# Patient Record
Sex: Male | Born: 1955 | Race: White | Hispanic: No | Smoking: Current every day smoker
Health system: Southern US, Community
[De-identification: ages and names within clinical notes are randomized; demographics above are authoritative.]

## PROBLEM LIST (undated history)

## (undated) DIAGNOSIS — K7400 Hepatic fibrosis, unspecified: Secondary | ICD-10-CM

## (undated) DIAGNOSIS — M545 Low back pain, unspecified: Secondary | ICD-10-CM

## (undated) DIAGNOSIS — M810 Age-related osteoporosis without current pathological fracture: Secondary | ICD-10-CM

## (undated) DIAGNOSIS — B1920 Unspecified viral hepatitis C without hepatic coma: Secondary | ICD-10-CM

## (undated) DIAGNOSIS — E785 Hyperlipidemia, unspecified: Secondary | ICD-10-CM

## (undated) DIAGNOSIS — H269 Unspecified cataract: Secondary | ICD-10-CM

## (undated) DIAGNOSIS — R002 Palpitations: Secondary | ICD-10-CM

## (undated) DIAGNOSIS — F32A Depression, unspecified: Secondary | ICD-10-CM

## (undated) DIAGNOSIS — Z8719 Personal history of other diseases of the digestive system: Secondary | ICD-10-CM

## (undated) DIAGNOSIS — Z9289 Personal history of other medical treatment: Secondary | ICD-10-CM

## (undated) DIAGNOSIS — G8929 Other chronic pain: Secondary | ICD-10-CM

## (undated) DIAGNOSIS — B009 Herpesviral infection, unspecified: Secondary | ICD-10-CM

## (undated) DIAGNOSIS — K219 Gastro-esophageal reflux disease without esophagitis: Secondary | ICD-10-CM

## (undated) DIAGNOSIS — Z87891 Personal history of nicotine dependence: Secondary | ICD-10-CM

## (undated) DIAGNOSIS — S82001A Unspecified fracture of right patella, initial encounter for closed fracture: Secondary | ICD-10-CM

## (undated) DIAGNOSIS — F329 Major depressive disorder, single episode, unspecified: Secondary | ICD-10-CM

## (undated) DIAGNOSIS — I252 Old myocardial infarction: Secondary | ICD-10-CM

## (undated) DIAGNOSIS — I10 Essential (primary) hypertension: Secondary | ICD-10-CM

## (undated) DIAGNOSIS — I2510 Atherosclerotic heart disease of native coronary artery without angina pectoris: Secondary | ICD-10-CM

## (undated) DIAGNOSIS — F419 Anxiety disorder, unspecified: Secondary | ICD-10-CM

## (undated) HISTORY — PX: CARDIAC CATHETERIZATION: SHX172

## (undated) HISTORY — DX: Other chronic pain: G89.29

## (undated) HISTORY — DX: Major depressive disorder, single episode, unspecified: F32.9

## (undated) HISTORY — DX: Palpitations: R00.2

## (undated) HISTORY — PX: EYE SURGERY: SHX253

## (undated) HISTORY — DX: Age-related osteoporosis without current pathological fracture: M81.0

## (undated) HISTORY — PX: CORONARY STENT PLACEMENT: SHX1402

## (undated) HISTORY — DX: Anxiety disorder, unspecified: F41.9

## (undated) HISTORY — DX: Unspecified cataract: H26.9

## (undated) HISTORY — DX: Gastro-esophageal reflux disease without esophagitis: K21.9

## (undated) HISTORY — DX: Hepatic fibrosis, unspecified: K74.00

## (undated) HISTORY — DX: Low back pain, unspecified: M54.50

## (undated) HISTORY — DX: Unspecified fracture of right patella, initial encounter for closed fracture: S82.001A

## (undated) HISTORY — DX: Low back pain: M54.5

## (undated) HISTORY — DX: Herpesviral infection, unspecified: B00.9

## (undated) HISTORY — DX: Unspecified viral hepatitis C without hepatic coma: B19.20

## (undated) HISTORY — DX: Personal history of nicotine dependence: Z87.891

## (undated) HISTORY — DX: Depression, unspecified: F32.A

## (undated) HISTORY — DX: Atherosclerotic heart disease of native coronary artery without angina pectoris: I25.10

## (undated) HISTORY — DX: Hyperlipidemia, unspecified: E78.5

## (undated) HISTORY — PX: KNEE SURGERY: SHX244

## (undated) HISTORY — DX: Personal history of other medical treatment: Z92.89

## (undated) HISTORY — DX: Essential (primary) hypertension: I10

## (undated) HISTORY — DX: Hepatic fibrosis: K74.0

---

## 1998-04-15 ENCOUNTER — Encounter: Payer: Self-pay | Admitting: Family Medicine

## 1998-04-15 ENCOUNTER — Ambulatory Visit (HOSPITAL_COMMUNITY): Admission: RE | Admit: 1998-04-15 | Discharge: 1998-04-15 | Payer: Self-pay | Admitting: Family Medicine

## 1998-05-16 ENCOUNTER — Ambulatory Visit (HOSPITAL_COMMUNITY): Admission: RE | Admit: 1998-05-16 | Discharge: 1998-05-16 | Payer: Self-pay | Admitting: Family Medicine

## 1998-05-16 ENCOUNTER — Encounter: Payer: Self-pay | Admitting: Family Medicine

## 1999-02-22 ENCOUNTER — Encounter: Payer: Self-pay | Admitting: Emergency Medicine

## 1999-02-22 ENCOUNTER — Emergency Department (HOSPITAL_COMMUNITY): Admission: EM | Admit: 1999-02-22 | Discharge: 1999-02-22 | Payer: Self-pay | Admitting: Emergency Medicine

## 2002-10-22 ENCOUNTER — Ambulatory Visit (HOSPITAL_COMMUNITY): Admission: RE | Admit: 2002-10-22 | Discharge: 2002-10-22 | Payer: Self-pay | Admitting: Internal Medicine

## 2002-10-22 ENCOUNTER — Encounter: Payer: Self-pay | Admitting: Internal Medicine

## 2002-12-07 ENCOUNTER — Encounter: Payer: Self-pay | Admitting: Internal Medicine

## 2002-12-07 ENCOUNTER — Ambulatory Visit (HOSPITAL_COMMUNITY): Admission: RE | Admit: 2002-12-07 | Discharge: 2002-12-07 | Payer: Self-pay | Admitting: Internal Medicine

## 2002-12-08 ENCOUNTER — Encounter: Payer: Self-pay | Admitting: Neurosurgery

## 2002-12-08 ENCOUNTER — Ambulatory Visit (HOSPITAL_COMMUNITY): Admission: RE | Admit: 2002-12-08 | Discharge: 2002-12-08 | Payer: Self-pay | Admitting: Neurosurgery

## 2002-12-29 ENCOUNTER — Encounter: Admission: RE | Admit: 2002-12-29 | Discharge: 2002-12-29 | Payer: Self-pay | Admitting: Neurosurgery

## 2003-01-17 ENCOUNTER — Encounter: Admission: RE | Admit: 2003-01-17 | Discharge: 2003-01-17 | Payer: Self-pay | Admitting: Neurosurgery

## 2003-02-26 ENCOUNTER — Ambulatory Visit (HOSPITAL_COMMUNITY): Admission: RE | Admit: 2003-02-26 | Discharge: 2003-02-26 | Payer: Self-pay | Admitting: Internal Medicine

## 2003-04-05 ENCOUNTER — Encounter: Admission: RE | Admit: 2003-04-05 | Discharge: 2003-05-04 | Payer: Self-pay | Admitting: Neurological Surgery

## 2003-04-16 ENCOUNTER — Ambulatory Visit (HOSPITAL_COMMUNITY): Admission: RE | Admit: 2003-04-16 | Discharge: 2003-04-16 | Payer: Self-pay | Admitting: Internal Medicine

## 2004-10-31 ENCOUNTER — Inpatient Hospital Stay (HOSPITAL_COMMUNITY): Admission: EM | Admit: 2004-10-31 | Discharge: 2004-11-04 | Payer: Self-pay | Admitting: Emergency Medicine

## 2004-11-02 ENCOUNTER — Ambulatory Visit: Payer: Self-pay | Admitting: Cardiology

## 2004-11-11 ENCOUNTER — Ambulatory Visit: Payer: Self-pay | Admitting: Nurse Practitioner

## 2004-11-14 ENCOUNTER — Ambulatory Visit: Payer: Self-pay | Admitting: Nurse Practitioner

## 2004-11-14 ENCOUNTER — Ambulatory Visit: Payer: Self-pay

## 2004-11-25 ENCOUNTER — Ambulatory Visit: Payer: Self-pay | Admitting: Nurse Practitioner

## 2004-12-04 ENCOUNTER — Ambulatory Visit: Payer: Self-pay | Admitting: *Deleted

## 2005-01-28 ENCOUNTER — Ambulatory Visit: Payer: Self-pay | Admitting: Nurse Practitioner

## 2005-02-03 ENCOUNTER — Ambulatory Visit: Payer: Self-pay | Admitting: Cardiology

## 2005-04-13 ENCOUNTER — Ambulatory Visit: Payer: Self-pay | Admitting: Nurse Practitioner

## 2005-05-13 ENCOUNTER — Ambulatory Visit: Payer: Self-pay | Admitting: Nurse Practitioner

## 2005-06-12 ENCOUNTER — Ambulatory Visit: Payer: Self-pay | Admitting: Nurse Practitioner

## 2005-06-15 ENCOUNTER — Ambulatory Visit: Payer: Self-pay | Admitting: Nurse Practitioner

## 2005-06-23 ENCOUNTER — Ambulatory Visit: Payer: Self-pay | Admitting: Nurse Practitioner

## 2005-07-24 ENCOUNTER — Ambulatory Visit: Payer: Self-pay | Admitting: Cardiology

## 2005-12-18 ENCOUNTER — Ambulatory Visit: Payer: Self-pay | Admitting: Nurse Practitioner

## 2006-01-25 ENCOUNTER — Ambulatory Visit: Payer: Self-pay | Admitting: Cardiology

## 2006-04-30 ENCOUNTER — Ambulatory Visit: Payer: Self-pay | Admitting: Cardiology

## 2006-04-30 LAB — CONVERTED CEMR LAB
Bilirubin, Direct: 0.1 mg/dL (ref 0.0–0.3)
Cholesterol: 126 mg/dL (ref 0–200)
HDL: 43.1 mg/dL (ref >39.0)
LDL Cholesterol: 73 mg/dL (ref 0–99)
Total Bilirubin: 1.1 mg/dL (ref 0.3–1.2)
Total CHOL/HDL Ratio: 2.9
Total Protein: 6.4 g/dL (ref 6.0–8.3)
Triglycerides: 52 mg/dL (ref 0–149)

## 2006-05-20 ENCOUNTER — Ambulatory Visit: Payer: Self-pay | Admitting: Nurse Practitioner

## 2006-06-08 ENCOUNTER — Ambulatory Visit: Payer: Self-pay

## 2006-08-03 ENCOUNTER — Ambulatory Visit: Payer: Self-pay | Admitting: Internal Medicine

## 2006-08-13 ENCOUNTER — Ambulatory Visit (HOSPITAL_COMMUNITY): Admission: RE | Admit: 2006-08-13 | Discharge: 2006-08-13 | Payer: Self-pay | Admitting: Family Medicine

## 2006-10-12 DIAGNOSIS — I2510 Atherosclerotic heart disease of native coronary artery without angina pectoris: Secondary | ICD-10-CM | POA: Insufficient documentation

## 2006-10-12 DIAGNOSIS — M545 Low back pain: Secondary | ICD-10-CM

## 2006-10-12 DIAGNOSIS — I252 Old myocardial infarction: Secondary | ICD-10-CM

## 2006-10-12 DIAGNOSIS — F329 Major depressive disorder, single episode, unspecified: Secondary | ICD-10-CM

## 2006-10-12 DIAGNOSIS — K219 Gastro-esophageal reflux disease without esophagitis: Secondary | ICD-10-CM

## 2006-10-27 ENCOUNTER — Encounter (INDEPENDENT_AMBULATORY_CARE_PROVIDER_SITE_OTHER): Payer: Self-pay | Admitting: Nurse Practitioner

## 2006-10-27 ENCOUNTER — Ambulatory Visit: Payer: Self-pay | Admitting: Internal Medicine

## 2006-10-27 LAB — CONVERTED CEMR LAB: PSA: 0.23 ng/mL (ref 0.10–4.00)

## 2006-11-01 ENCOUNTER — Ambulatory Visit: Payer: Self-pay | Admitting: Cardiology

## 2006-11-01 LAB — CONVERTED CEMR LAB
ALT: 41 units/L (ref 0–53)
AST: 39 units/L — ABNORMAL HIGH (ref 0–37)
Alkaline Phosphatase: 28 units/L — ABNORMAL LOW (ref 39–117)
Bilirubin, Direct: 0.2 mg/dL (ref 0.0–0.3)
Cholesterol: 119 mg/dL (ref 0–200)
HDL: 43.3 mg/dL (ref >39.0)
LDL Cholesterol: 61 mg/dL (ref 0–99)
Total Bilirubin: 1.2 mg/dL (ref 0.3–1.2)
Total Protein: 7 g/dL (ref 6.0–8.3)

## 2006-11-10 ENCOUNTER — Encounter (INDEPENDENT_AMBULATORY_CARE_PROVIDER_SITE_OTHER): Payer: Self-pay | Admitting: *Deleted

## 2006-11-15 ENCOUNTER — Emergency Department (HOSPITAL_COMMUNITY): Admission: EM | Admit: 2006-11-15 | Discharge: 2006-11-15 | Payer: Self-pay | Admitting: *Deleted

## 2006-11-17 ENCOUNTER — Emergency Department (HOSPITAL_COMMUNITY): Admission: EM | Admit: 2006-11-17 | Discharge: 2006-11-17 | Payer: Self-pay | Admitting: Family Medicine

## 2007-01-13 ENCOUNTER — Ambulatory Visit: Payer: Self-pay | Admitting: Internal Medicine

## 2007-04-07 ENCOUNTER — Ambulatory Visit (HOSPITAL_BASED_OUTPATIENT_CLINIC_OR_DEPARTMENT_OTHER): Admission: RE | Admit: 2007-04-07 | Discharge: 2007-04-07 | Payer: Self-pay | Admitting: Orthopedic Surgery

## 2007-04-12 ENCOUNTER — Encounter: Admission: RE | Admit: 2007-04-12 | Discharge: 2007-04-29 | Payer: Self-pay | Admitting: Orthopedic Surgery

## 2007-04-29 ENCOUNTER — Ambulatory Visit: Payer: Self-pay | Admitting: Cardiology

## 2007-05-04 ENCOUNTER — Ambulatory Visit: Payer: Self-pay | Admitting: Cardiology

## 2007-05-04 LAB — CONVERTED CEMR LAB
AST: 33 units/L (ref 0–37)
Albumin: 3.9 g/dL (ref 3.5–5.2)
Alkaline Phosphatase: 31 units/L — ABNORMAL LOW (ref 39–117)
Triglycerides: 72 mg/dL (ref 0–149)
VLDL: 14 mg/dL (ref 0–40)

## 2007-05-27 ENCOUNTER — Ambulatory Visit: Payer: Self-pay | Admitting: Internal Medicine

## 2007-09-15 ENCOUNTER — Ambulatory Visit: Payer: Self-pay | Admitting: Internal Medicine

## 2007-09-21 ENCOUNTER — Ambulatory Visit: Payer: Self-pay | Admitting: Internal Medicine

## 2007-09-21 LAB — CONVERTED CEMR LAB
ALT: 59 units/L — ABNORMAL HIGH (ref 0–53)
Bilirubin, Direct: 0.2 mg/dL (ref 0.0–0.3)
Cholesterol: 173 mg/dL (ref 0–200)
LDL Cholesterol: 99 mg/dL (ref 0–99)
Total CHOL/HDL Ratio: 3.1
Total Protein: 7.5 g/dL (ref 6.0–8.3)
VLDL: 19 mg/dL (ref 0–40)

## 2007-11-10 ENCOUNTER — Ambulatory Visit: Payer: Self-pay | Admitting: Internal Medicine

## 2007-11-10 LAB — CONVERTED CEMR LAB
AST: 27 units/L (ref 0–37)
Albumin: 5 g/dL (ref 3.5–5.2)
Alkaline Phosphatase: 35 units/L — ABNORMAL LOW (ref 39–117)
Chloride: 105 meq/L (ref 96–112)
Potassium: 4.5 meq/L (ref 3.5–5.3)
Sodium: 141 meq/L (ref 135–145)
Total Protein: 7.7 g/dL (ref 6.0–8.3)

## 2007-12-05 ENCOUNTER — Ambulatory Visit: Payer: Self-pay | Admitting: Internal Medicine

## 2007-12-05 LAB — CONVERTED CEMR LAB
Amphetamine Screen, Ur: NEGATIVE
Barbiturate Quant, Ur: NEGATIVE
Cocaine Metabolites: NEGATIVE
Marijuana Metabolite: NEGATIVE
Opiate Screen, Urine: NEGATIVE

## 2008-02-07 ENCOUNTER — Ambulatory Visit: Payer: Self-pay | Admitting: Internal Medicine

## 2008-03-06 ENCOUNTER — Ambulatory Visit: Payer: Self-pay | Admitting: Internal Medicine

## 2008-04-09 ENCOUNTER — Ambulatory Visit: Payer: Self-pay | Admitting: Cardiology

## 2008-04-09 LAB — CONVERTED CEMR LAB
GFR calc Af Amer: 90 mL/min
Glucose, Bld: 88 mg/dL (ref 70–99)
HCT: 43.1 % (ref 39.0–52.0)
INR: 1 (ref 0.8–1.0)
LDL Cholesterol: 86 mg/dL (ref 0–99)
Lymphocytes Relative: 30.6 % (ref 12.0–46.0)
Monocytes Absolute: 0.4 10*3/uL (ref 0.1–1.0)
Monocytes Relative: 7.9 % (ref 3.0–12.0)
Platelets: 186 10*3/uL (ref 150–400)
Potassium: 4.1 meq/L (ref 3.5–5.1)
Prothrombin Time: 10.5 s — ABNORMAL LOW (ref 10.9–13.3)
RDW: 12.4 % (ref 11.5–14.6)
Sodium: 139 meq/L (ref 135–145)
Total CHOL/HDL Ratio: 4.5
VLDL: 27 mg/dL (ref 0–40)

## 2008-04-12 ENCOUNTER — Ambulatory Visit (HOSPITAL_COMMUNITY): Admission: RE | Admit: 2008-04-12 | Discharge: 2008-04-12 | Payer: Self-pay | Admitting: Cardiology

## 2008-04-12 ENCOUNTER — Ambulatory Visit: Payer: Self-pay | Admitting: Cardiology

## 2008-04-30 ENCOUNTER — Ambulatory Visit: Payer: Self-pay | Admitting: Internal Medicine

## 2008-04-30 LAB — CONVERTED CEMR LAB
Band Neutrophils: 0 %
Basophils Absolute: 0 K/uL
Basophils Relative: 0 %
Eosinophils Absolute: 0 K/uL
Eosinophils Relative: 1 %
HCT: 45.4 %
Hemoglobin: 15.1 g/dL
Lymphocytes Relative: 20 %
Lymphs Abs: 1.3 K/uL
MCHC: 33.3 g/dL
MCV: 93.8 fL
Monocytes Absolute: 0.4 K/uL
Monocytes Relative: 6 %
Neutro Abs: 4.8 K/uL
Neutrophils Relative %: 73 %
Platelets: 213 K/uL
RBC: 4.84 M/uL
RDW: 13.2 %
WBC: 6.6 10*3/microliter

## 2008-05-03 ENCOUNTER — Ambulatory Visit (HOSPITAL_COMMUNITY): Admission: RE | Admit: 2008-05-03 | Discharge: 2008-05-03 | Payer: Self-pay | Admitting: Internal Medicine

## 2008-05-04 ENCOUNTER — Ambulatory Visit: Payer: Self-pay | Admitting: Internal Medicine

## 2008-07-04 ENCOUNTER — Ambulatory Visit: Payer: Self-pay | Admitting: Internal Medicine

## 2008-07-06 ENCOUNTER — Ambulatory Visit (HOSPITAL_COMMUNITY): Admission: RE | Admit: 2008-07-06 | Discharge: 2008-07-06 | Payer: Self-pay | Admitting: Internal Medicine

## 2008-08-15 ENCOUNTER — Ambulatory Visit: Payer: Self-pay | Admitting: Internal Medicine

## 2008-09-12 ENCOUNTER — Ambulatory Visit: Payer: Self-pay | Admitting: Internal Medicine

## 2008-10-05 ENCOUNTER — Ambulatory Visit: Payer: Self-pay | Admitting: Internal Medicine

## 2008-10-05 ENCOUNTER — Encounter (INDEPENDENT_AMBULATORY_CARE_PROVIDER_SITE_OTHER): Payer: Self-pay | Admitting: Internal Medicine

## 2008-10-05 LAB — CONVERTED CEMR LAB
Amphetamine Screen, Ur: NEGATIVE
Benzodiazepines.: POSITIVE — AB
Cocaine Metabolites: NEGATIVE
Phencyclidine (PCP): NEGATIVE

## 2008-10-08 ENCOUNTER — Encounter (INDEPENDENT_AMBULATORY_CARE_PROVIDER_SITE_OTHER): Payer: Self-pay | Admitting: Internal Medicine

## 2009-01-04 ENCOUNTER — Ambulatory Visit: Payer: Self-pay | Admitting: Family Medicine

## 2009-01-04 ENCOUNTER — Encounter (INDEPENDENT_AMBULATORY_CARE_PROVIDER_SITE_OTHER): Payer: Self-pay | Admitting: Internal Medicine

## 2009-01-04 ENCOUNTER — Ambulatory Visit: Payer: Self-pay | Admitting: Internal Medicine

## 2009-01-04 ENCOUNTER — Emergency Department (HOSPITAL_COMMUNITY): Admission: EM | Admit: 2009-01-04 | Discharge: 2009-01-04 | Payer: Self-pay | Admitting: Emergency Medicine

## 2009-01-04 LAB — CONVERTED CEMR LAB
ALT: 29 units/L (ref 0–53)
Albumin: 4.5 g/dL (ref 3.5–5.2)
CO2: 22 meq/L (ref 19–32)
Cholesterol: 118 mg/dL (ref 0–200)
Glucose, Bld: 95 mg/dL (ref 70–99)
LDL Cholesterol: 61 mg/dL (ref 0–99)
Potassium: 4.1 meq/L (ref 3.5–5.3)
Sodium: 139 meq/L (ref 135–145)
Total Protein: 6.7 g/dL (ref 6.0–8.3)
Triglycerides: 58 mg/dL (ref <150)

## 2009-01-31 ENCOUNTER — Encounter: Payer: Self-pay | Admitting: Cardiology

## 2009-01-31 ENCOUNTER — Ambulatory Visit: Payer: Self-pay | Admitting: Family Medicine

## 2009-02-13 ENCOUNTER — Encounter (INDEPENDENT_AMBULATORY_CARE_PROVIDER_SITE_OTHER): Payer: Self-pay | Admitting: Internal Medicine

## 2009-02-13 ENCOUNTER — Ambulatory Visit: Payer: Self-pay | Admitting: Internal Medicine

## 2009-02-13 LAB — CONVERTED CEMR LAB
Albumin: 4.6 g/dL (ref 3.5–5.2)
Alkaline Phosphatase: 39 units/L (ref 39–117)
BUN: 12 mg/dL (ref 6–23)
Basophils Absolute: 0 10*3/uL (ref 0.0–0.1)
CO2: 25 meq/L (ref 19–32)
Calcium: 10.3 mg/dL (ref 8.4–10.5)
Cholesterol: 132 mg/dL (ref 0–200)
Eosinophils Relative: 1 % (ref 0–5)
Glucose, Bld: 95 mg/dL (ref 70–99)
HCT: 45 % (ref 39.0–52.0)
HDL: 54 mg/dL (ref >39)
Hemoglobin: 14.6 g/dL (ref 13.0–17.0)
LDL Cholesterol: 67 mg/dL (ref 0–99)
Lymphocytes Relative: 29 % (ref 12–46)
MCHC: 32.4 g/dL (ref 30.0–36.0)
Monocytes Absolute: 0.5 10*3/uL (ref 0.1–1.0)
Monocytes Relative: 9 % (ref 3–12)
Potassium: 4.3 meq/L (ref 3.5–5.3)
RBC: 4.68 M/uL (ref 4.22–5.81)
RDW: 13.1 % (ref 11.5–15.5)
Total Protein: 7 g/dL (ref 6.0–8.3)
Triglycerides: 53 mg/dL (ref <150)

## 2009-02-19 ENCOUNTER — Ambulatory Visit: Payer: Self-pay | Admitting: Cardiology

## 2009-02-19 DIAGNOSIS — R079 Chest pain, unspecified: Secondary | ICD-10-CM

## 2009-02-19 DIAGNOSIS — I10 Essential (primary) hypertension: Secondary | ICD-10-CM

## 2009-02-19 DIAGNOSIS — R12 Heartburn: Secondary | ICD-10-CM

## 2009-02-19 DIAGNOSIS — E785 Hyperlipidemia, unspecified: Secondary | ICD-10-CM | POA: Insufficient documentation

## 2009-02-27 ENCOUNTER — Telehealth (INDEPENDENT_AMBULATORY_CARE_PROVIDER_SITE_OTHER): Payer: Self-pay | Admitting: *Deleted

## 2009-02-28 ENCOUNTER — Encounter (HOSPITAL_COMMUNITY): Admission: RE | Admit: 2009-02-28 | Discharge: 2009-04-29 | Payer: Self-pay | Admitting: Cardiology

## 2009-02-28 ENCOUNTER — Encounter: Payer: Self-pay | Admitting: Cardiovascular Disease

## 2009-02-28 ENCOUNTER — Ambulatory Visit: Payer: Self-pay

## 2009-02-28 ENCOUNTER — Ambulatory Visit: Payer: Self-pay | Admitting: Internal Medicine

## 2009-03-19 ENCOUNTER — Ambulatory Visit: Payer: Self-pay | Admitting: Internal Medicine

## 2009-03-26 ENCOUNTER — Ambulatory Visit: Payer: Self-pay | Admitting: Internal Medicine

## 2009-04-12 ENCOUNTER — Ambulatory Visit: Payer: Self-pay | Admitting: Family Medicine

## 2009-04-12 ENCOUNTER — Ambulatory Visit: Payer: Self-pay | Admitting: Internal Medicine

## 2009-04-19 ENCOUNTER — Ambulatory Visit: Payer: Self-pay | Admitting: Internal Medicine

## 2009-05-10 ENCOUNTER — Ambulatory Visit: Payer: Self-pay | Admitting: Internal Medicine

## 2009-05-24 ENCOUNTER — Ambulatory Visit: Payer: Self-pay | Admitting: Internal Medicine

## 2009-06-03 ENCOUNTER — Encounter (INDEPENDENT_AMBULATORY_CARE_PROVIDER_SITE_OTHER): Payer: Self-pay | Admitting: *Deleted

## 2009-06-18 ENCOUNTER — Ambulatory Visit: Payer: Self-pay | Admitting: Internal Medicine

## 2009-07-05 ENCOUNTER — Ambulatory Visit: Payer: Self-pay | Admitting: Internal Medicine

## 2009-07-19 ENCOUNTER — Ambulatory Visit: Payer: Self-pay | Admitting: Internal Medicine

## 2009-07-24 DIAGNOSIS — R002 Palpitations: Secondary | ICD-10-CM

## 2009-07-24 HISTORY — DX: Palpitations: R00.2

## 2009-07-25 ENCOUNTER — Ambulatory Visit: Payer: Self-pay | Admitting: Cardiology

## 2009-07-25 DIAGNOSIS — R002 Palpitations: Secondary | ICD-10-CM | POA: Insufficient documentation

## 2009-07-26 LAB — CONVERTED CEMR LAB
Bilirubin, Direct: 0.1 mg/dL (ref 0.0–0.3)
HDL: 46.8 mg/dL (ref >39.00)
LDL Cholesterol: 81 mg/dL (ref 0–99)
Total Bilirubin: 0.8 mg/dL (ref 0.3–1.2)
Total CHOL/HDL Ratio: 3
VLDL: 14.2 mg/dL (ref 0.0–40.0)

## 2009-07-29 ENCOUNTER — Telehealth (INDEPENDENT_AMBULATORY_CARE_PROVIDER_SITE_OTHER): Payer: Self-pay | Admitting: *Deleted

## 2009-07-29 ENCOUNTER — Ambulatory Visit: Payer: Self-pay | Admitting: Internal Medicine

## 2009-07-29 ENCOUNTER — Ambulatory Visit: Payer: Self-pay | Admitting: Cardiology

## 2009-07-29 LAB — CONVERTED CEMR LAB: Microalb, Ur: 0.5 mg/dL (ref 0.00–1.89)

## 2009-08-02 ENCOUNTER — Ambulatory Visit: Payer: Self-pay | Admitting: Internal Medicine

## 2009-08-09 ENCOUNTER — Telehealth: Payer: Self-pay | Admitting: Cardiology

## 2009-08-12 ENCOUNTER — Telehealth: Payer: Self-pay | Admitting: Cardiology

## 2009-08-23 ENCOUNTER — Ambulatory Visit: Payer: Self-pay | Admitting: Internal Medicine

## 2009-09-27 ENCOUNTER — Telehealth: Payer: Self-pay | Admitting: Cardiology

## 2009-10-31 ENCOUNTER — Telehealth: Payer: Self-pay | Admitting: Cardiology

## 2009-10-31 ENCOUNTER — Encounter: Payer: Self-pay | Admitting: Cardiology

## 2010-01-22 ENCOUNTER — Encounter: Payer: Self-pay | Admitting: Cardiology

## 2010-01-27 ENCOUNTER — Encounter: Payer: Self-pay | Admitting: Cardiology

## 2010-01-27 ENCOUNTER — Ambulatory Visit: Payer: Self-pay | Admitting: Cardiology

## 2010-01-28 ENCOUNTER — Telehealth: Payer: Self-pay | Admitting: Cardiology

## 2010-01-29 LAB — CONVERTED CEMR LAB
AST: 34 units/L (ref 0–37)
Albumin: 4.5 g/dL (ref 3.5–5.2)
Alkaline Phosphatase: 49 units/L (ref 39–117)
Bilirubin, Direct: 0.1 mg/dL (ref 0.0–0.3)
CO2: 26 meq/L (ref 19–32)
Calcium: 9.8 mg/dL (ref 8.4–10.5)
Glucose, Bld: 95 mg/dL (ref 70–99)
HDL: 34.2 mg/dL — ABNORMAL LOW (ref >39.00)
Potassium: 3.9 meq/L (ref 3.5–5.1)
Sodium: 138 meq/L (ref 135–145)
Total CHOL/HDL Ratio: 4
Total Protein: 7.3 g/dL (ref 6.0–8.3)
VLDL: 15.6 mg/dL (ref 0.0–40.0)

## 2010-03-06 ENCOUNTER — Telehealth: Payer: Self-pay | Admitting: Cardiology

## 2010-03-18 ENCOUNTER — Encounter: Payer: Self-pay | Admitting: Cardiology

## 2010-03-27 NOTE — Progress Notes (Signed)
Summary: when dose pt needs appt   Phone Note Call from Patient Call back at Home Phone [phone]   Caller: Patient Reason for Call: Talk to Nurse, Talk to Doctor Summary of Call: pt was called and told his lab work was out wack and he needs to have a earlier appt, I didn't see any notes stating this so I didn't know what to do because his next f/u is not til June Initial call taken by: Shelda Pal,  March 06, 2010 11:06 AM  Follow-up for Phone Call        Pt. called to reschedule the lab appointment from 03/26/10 to 03/31/10. Appointment was made for Fasting lipid and Liver panel for 03/31/10. Pt. aware. Follow-up by: Carollee Sires, RN, BSN,  March 06, 2010 12:00 PM

## 2010-03-27 NOTE — Letter (Signed)
Summary: Note Regarding 2006 Stint  Note Regarding 2006 Stint   Imported By: Marilynne Drivers 01/14/2010 10:38:54  _____________________________________________________________________  External Attachment:    Type:   Image     Comment:   External Document

## 2010-03-27 NOTE — Progress Notes (Signed)
Summary: Question about pt taking antibotics before dental work   Phone Note From Other Clinic Call back at [phone]   Caller: Receptionist/  Jamie Powers/[facility] Summary of Call: Dental office checking to see if pt needs antibiotics before dental work due to pt having stents  information acn be faxed to [phone] Initial call taken by: Delsa Sale,  October 31, 2009 11:37 AM  Follow-up for Phone Call        called and left message for Roselyn Reef requesting that she fax information requested to [phone] and  I could have Dr Aundra Dubin review today Desiree Lucy, RN, BSN  October 31, 2009 11:54 AM --talked with Ruthine Dose will fax info and I will ask Dr Aundra Dubin to review Desiree Lucy, RN, BSN  October 31, 2009 12:07 PM --reviewed with Dr Orpah Greek does not need antibiotics--fax information to [facility]

## 2010-03-27 NOTE — Assessment & Plan Note (Signed)
Summary: Cardiology Nuclear Study  Nuclear Med Background Indications for Stress Test: Evaluation for Ischemia, Stent Patency, Post Hospital  Indications Comments: [facility] ED 11/10 CP R/O MI  History: GXT, Myocardial Infarction, Myocardial Perfusion Study, Stents  History Comments: 09/06 MPS Antero-septal ischemia EF 53% '06 NSTEMI '06 LAD 02/10 GXT Patent stent EF 60%  Symptoms: Chest Pain, Chest Pressure    Nuclear Pre-Procedure Cardiac Risk Factors: Family History - CAD, History of Smoking, Hypertension, Lipids Caffeine/Decaff Intake: None NPO After: 11:00 PM Lungs: clear IV 0.9% NS with Angio Cath: 18g     IV Site: (R) AC IV Started by: Eliezer Lofts EMT-P Chest Size (in) 46     Height (in): 73 Weight (lb): 188 BMI: 24.89  Nuclear Med Study 1 or 2 day study:  1 day     Stress Test Type:  Carlton Adam Reading MD:  Dorris Carnes, MD     Referring MD:  D.Mclean Resting Radionuclide:  Technetium 15mTetrofosmin     Resting Radionuclide Dose:  11.0 mCi  Stress Radionuclide:  Technetium 958metrofosmin     Stress Radionuclide Dose:  33.0 mCi   Stress Protocol   Lexiscan: 0.4 mg   Stress Test Technologist:  SaPerrin MalteseMT-P     Nuclear Technologist:  WaMariann Lastereal RT-N  Rest Procedure  Myocardial perfusion imaging was performed at rest 45 minutes following the intravenous administration of Myoview Technetium 9990mtrofosmin.  Stress Procedure  The patient received IV Lexiscan 0.4 mg over 15-seconds.  Myoview injected at 30-seconds.  There were no significant changes with infusion.  Quantitative spect images were obtained after a 45 minute delay.  QPS Raw Data Images:  Rest images were motion corrected.  Note soft tissue (diaphragm, bowel activity) underlie heart. Stress Images:  Minimall thinning in the inferior wall (base)  Otherwise normal perfusion. Rest Images:  No signficant change from the stress images. Subtraction (SDS):  No evidence of ischemia. Transient Ischemic  Dilatation:  .97  (Normal <1.22)  Lung/Heart Ratio:  .26  (Normal <0.45)  Quantitative Gated Spect Images QGS EDV:  89 ml QGS ESV:  32 ml QGS EF:  64 %   Overall Impression  Exercise Capacity: Lexiscan infusion. BP Response: Normal blood pressure response. Clinical Symptoms: No chest pain ECG Impression: No significant ST segment change suggestive of ischemia. Overall Impression: Normal stress nuclear study.  Appended Document: Cardiology Nuclear Study normal study  Appended Document: Cardiology Nuclear Study talked with patient

## 2010-03-27 NOTE — Letter (Signed)
Summary: Appointment - Reminder [place], [place]  [address] [place]   [place], [place] [address]   Phone: [phone]  Fax: [phone]     June 03, 2009 MRN: [id number]   [place], [place]  [address]   Dear Mr. VALLEJO,  [facility] records indicate that it is time to schedule a follow-up appointment with Dr. Aundra Dubin. It is very important that we reach you to schedule this appointment. We look forward to participating in your health care needs. Please contact us at the number listed above at your earliest convenience to schedule your appointment.  If you are unable to make an appointment at this time, give [place] a call so we can update our records.     Sincerely,    Darnell Level [facility] Scheduling Team

## 2010-03-27 NOTE — Letter (Signed)
Summary: [facility]   Imported By: Marilynne Drivers 02/10/2010 14:51:10  _____________________________________________________________________  External Attachment:    Type:   Image     Comment:   External Document

## 2010-03-27 NOTE — Progress Notes (Signed)
Summary: question re labs   Phone Note Call from Patient   Caller: Patient Reason for Call: Talk to Nurse Summary of Call: calling asking when his fasting lab was for today-i don't see a lab scheduled for him at all-wants to check to see when he's suppose to have this done-pls call [phone] Initial call taken by: Lorenda Hatchet,  September 27, 2009 10:15 AM  Follow-up for Phone Call        8:30 on 10/02/09 for fasting labs Janan Halter, RN, BSN  September 27, 2009 10:52 AM

## 2010-03-27 NOTE — Progress Notes (Signed)
Summary: monitor results 07/29/09-07/31/09   Phone Note Outgoing Call   Call placed by: Desiree Lucy, RN, BSN,  August 12, 2009 8:23 AM Call placed to: Patient Summary of Call: holter monitor results  Follow-up for Phone Call        Monitor 07/29/09-07/31/09 reviewed by Dr Aundra Dubin 08/09/09--occ PACs could be cause of palpitations-Anne Lankford,RN --discussed results with pt by telephone

## 2010-03-27 NOTE — Progress Notes (Signed)
Summary: Nuclear Pre-Procedure  Phone Note Outgoing Call   Call placed by: Perrin Maltese, EMT-P,  February 27, 2009 2:36 PM Summary of Call: Reviewed information on Myoview Information Sheet (see scanned document for further details).  Spoke with patient.     Nuclear Med Background Indications for Stress Test: Evaluation for Ischemia, Stent Patency, Post Hospital  Indications Comments: [facility] ED 11/10 CP R/O MI  History: GXT, Myocardial Infarction, Myocardial Perfusion Study, Stents  History Comments: 09/06 MPS Antero-septal ischemia EF 53% '06 NSTEMI '06 LAD 02/10 GXT Patent stent EF 60%  Symptoms: Chest Pain, Chest Pressure    Nuclear Pre-Procedure Cardiac Risk Factors: Family History - CAD, History of Smoking, Hypertension, Lipids Height (in): 16  Nuclear Med Study Referring MD:  D.Mclean

## 2010-03-27 NOTE — Progress Notes (Signed)
  Phone Note Other Incoming   Request: Send information Summary of Call: Request for records received from the [facility]. Request forwarded to [facility].
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
## 2010-03-27 NOTE — Procedures (Signed)
Summary: summary report  summary report   Imported By: Parks Neptune 08/13/2009 10:37:00  _____________________________________________________________________  External Attachment:    Type:   Image     Comment:   External Document

## 2010-03-27 NOTE — Progress Notes (Signed)
Summary: monitor results 07/29/2009-07/31/2009   Phone Note Outgoing Call   Call placed by: Desiree Lucy, RN, BSN,  August 09, 2009 6:03 PM Call placed to: Patient Summary of Call: monitor results  Follow-up for Phone Call        attempted to contact pt to discuss monitor results 07/29/2009-07/31/2009 reviewed by by Dr Aundra Dubin- occ PACs-could be cause of palpitations--LMTCB Desiree Lucy, RN, BSN  August 09, 2009 6:03 PM  discussed results with pt by telephone

## 2010-03-27 NOTE — Progress Notes (Signed)
Summary: needs refill/nitrostat  Medications Added NITROSTAT 0.4 MG SUBL (NITROGLYCERIN) 1 tablet under tongue at onset of chest pain; you may repeat every 5 minutes for up to 3 doses.       Phone Note Refill Request Message from:  Patient on [place] in [place] - [phone]  Refills Requested: Medication #1:  NITROQUICK 0.3 MG  SUBL one sl q 5 min as needed max 3 ( NEED REFILL) Initial call taken by: Shelda Pal,  January 28, 2010 11:59 AM    New/Updated Medications: NITROSTAT 0.4 MG SUBL (NITROGLYCERIN) 1 tablet under tongue at onset of chest pain; you may repeat every 5 minutes for up to 3 doses. Prescriptions: NITROSTAT 0.4 MG SUBL (NITROGLYCERIN) 1 tablet under tongue at onset of chest pain; you may repeat every 5 minutes for up to 3 doses.  #100 x 3   Entered by:   Philemon Kingdom   Authorized by:   [name], MD   Signed by:   Philemon Kingdom on 01/29/2010   Method used:   Electronically to        [facility] (retail)       [address]       [place], [place]  [address]       Ph: [phone]       Fax: [phone]   RxID:   [id number]  Approved by Desiree Lucy RN Philemon Kingdom :)

## 2010-03-31 ENCOUNTER — Other Ambulatory Visit: Payer: Self-pay | Admitting: Cardiology

## 2010-03-31 ENCOUNTER — Encounter: Payer: Self-pay | Admitting: Cardiology

## 2010-03-31 ENCOUNTER — Other Ambulatory Visit (INDEPENDENT_AMBULATORY_CARE_PROVIDER_SITE_OTHER): Payer: Medicare Other

## 2010-03-31 DIAGNOSIS — Z79899 Other long term (current) drug therapy: Secondary | ICD-10-CM

## 2010-03-31 DIAGNOSIS — I2510 Atherosclerotic heart disease of native coronary artery without angina pectoris: Secondary | ICD-10-CM

## 2010-03-31 DIAGNOSIS — E785 Hyperlipidemia, unspecified: Secondary | ICD-10-CM

## 2010-03-31 LAB — LIPID PANEL
Cholesterol: 103 mg/dL (ref 0–200)
LDL Cholesterol: 49 mg/dL (ref 0–99)
Triglycerides: 74 mg/dL (ref 0.0–149.0)
VLDL: 14.8 mg/dL (ref 0.0–40.0)

## 2010-03-31 LAB — HEPATIC FUNCTION PANEL
ALT: 25 U/L (ref 0–53)
Albumin: 4.7 g/dL (ref 3.5–5.2)
Alkaline Phosphatase: 49 U/L (ref 39–117)
Bilirubin, Direct: 0.2 mg/dL (ref 0.0–0.3)
Total Protein: 7.7 g/dL (ref 6.0–8.3)

## 2010-04-01 ENCOUNTER — Other Ambulatory Visit: Payer: Self-pay

## 2010-04-01 ENCOUNTER — Encounter: Payer: Self-pay | Admitting: Cardiology

## 2010-04-01 ENCOUNTER — Ambulatory Visit
Admission: RE | Admit: 2010-04-01 | Discharge: 2010-04-01 | Disposition: A | Discharge status: Home / Self Care | Payer: Medicare Other | Source: Ambulatory Visit

## 2010-04-01 DIAGNOSIS — M545 Low back pain: Secondary | ICD-10-CM

## 2010-04-01 DIAGNOSIS — M79606 Pain in leg, unspecified: Secondary | ICD-10-CM

## 2010-04-10 NOTE — Letter (Signed)
Summary: [place] [place] [address] [place]   [place], [place] [address]   Phone: [phone]  Fax: [phone]     April 01, 2010 MRN: [id number]   RAYQUAN AMRHEIN [address] [place], [place]  [address]   Dear Mr. MECKEL,  Dr Aundra Dubin has  reviewed your cholesterol results.  They are as follows:     Total Cholesterol:    103 (Desirable: less than 200)       HDL  Cholesterol:     39.50  (Desirable: greater than 40 for men and 50 for women)       LDL Cholesterol:       49  (Desirable: less than 100 for low risk and less than 70 for moderate to high risk)       Triglycerides:       74.0  (Desirable: less than 150)  His recommendations include: no new recommendations. Continue taking Crestor. Your cholesterol is excellent!   Call our office at the number listed above if you have any questions.  Lowering your LDL cholesterol is important, but it is only one of a large number of "risk factors" that may indicate that you are at risk for heart disease, stroke or other complications of hardening of the arteries.  Other risk factors include:   A.  Cigarette Smoking* B.  High Blood Pressure* C.  Obesity* D.   Low HDL Cholesterol (see yours above)* E.   Diabetes Mellitus (higher risk if your is uncontrolled) F.  Family history of premature heart disease G.  Previous history of stroke or cardiovascular disease    *These are risk factors YOU HAVE CONTROL OVER.  For more information, visit .  There is now evidence that lowering the TOTAL CHOLESTEROL AND LDL CHOLESTEROL can reduce the risk of heart disease.  The American Heart Association recommends the following guidelines for the treatment of elevated cholesterol:  1.  If there is now current heart disease and less than two risk factors, TOTAL CHOLESTEROL should be less than 200 and LDL CHOLESTEROL should be less than 100. 2.  If there is current heart disease or two or more risk factors, TOTAL  CHOLESTEROL should be less than 200 and LDL CHOLESTEROL should be less than 70.  A diet low in cholesterol, saturated fat, and calories is the cornerstone of treatment for elevated cholesterol.  Cessation of smoking and exercise are also important in the management of elevated cholesterol and preventing vascular disease.  Studies have shown that 30 to 60 minutes of physical activity most days can help lower blood pressure, lower cholesterol, and keep your weight at a healthy level.  Drug therapy is used when cholesterol levels do not respond to therapeutic lifestyle changes (smoking cessation, diet, and exercise) and remains unacceptably high.  If medication is started, it is important to have you levels checked periodically to evaluate the need for further treatment options.  Thank you,   Eliot Ford

## 2010-04-16 NOTE — Letter (Signed)
Summary: [facility]   [facility]   Imported By: Sallee Provencal 04/11/2010 15:58:29  _____________________________________________________________________  External Attachment:    Type:   Image     Comment:   External Document

## 2010-05-28 LAB — DIFFERENTIAL
Lymphs Abs: 1.6 10*3/uL (ref 0.7–4.0)
Monocytes Absolute: 0.3 10*3/uL (ref 0.1–1.0)
Monocytes Relative: 7 % (ref 3–12)
Neutro Abs: 2.9 10*3/uL (ref 1.7–7.7)
Neutrophils Relative %: 60 % (ref 43–77)

## 2010-05-28 LAB — BASIC METABOLIC PANEL
BUN: 9 mg/dL (ref 6–23)
CO2: 30 mEq/L (ref 19–32)
Chloride: 105 mEq/L (ref 96–112)
Creatinine, Ser: 1.15 mg/dL (ref 0.4–1.5)
Potassium: 4.7 mEq/L (ref 3.5–5.1)

## 2010-05-28 LAB — CBC
HCT: 40.4 % (ref 39.0–52.0)
MCHC: 34.2 g/dL (ref 30.0–36.0)
MCV: 96.1 fL (ref 78.0–100.0)
Platelets: 201 10*3/uL (ref 150–400)
RBC: 4.21 MIL/uL — ABNORMAL LOW (ref 4.22–5.81)
WBC: 4.9 10*3/uL (ref 4.0–10.5)

## 2010-05-28 LAB — TROPONIN I: Troponin I: 0.01 ng/mL (ref 0.00–0.06)

## 2010-06-13 ENCOUNTER — Telehealth: Payer: Self-pay | Admitting: Cardiology

## 2010-06-14 MED ORDER — ROSUVASTATIN CALCIUM 40 MG PO TABS: 40.0000 mg | ORAL_TABLET | Freq: Every day | ORAL | Status: DC

## 2010-06-14 NOTE — Telephone Encounter (Signed)
prescription sent

## 2010-06-16 ENCOUNTER — Other Ambulatory Visit: Payer: Self-pay | Admitting: Pain Medicine

## 2010-06-16 DIAGNOSIS — M545 Low back pain: Secondary | ICD-10-CM

## 2010-06-16 DIAGNOSIS — M79606 Pain in leg, unspecified: Secondary | ICD-10-CM
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
## 2010-07-08 NOTE — Cardiovascular Report (Signed)
NAME:  CREEK, GAN NO.:  [id number]   MEDICAL RECORD NO.:  [id number]          PATIENT TYPE:  OIB   LOCATION:  2899                         FACILITY:  [facility]   PHYSICIAN:  [name], MD      DATE OF BIRTH:  Jul 23, 1955   DATE OF PROCEDURE:  DATE OF DISCHARGE:                            CARDIAC CATHETERIZATION   PRIMARY CARE PHYSICIAN:  [facility].   INDICATIONS:  Unstable angina with a history of coronary artery disease  status post LAD PCI.   PROCEDURES:  1. Left heart catheterization.  2. Coronary angiography.  3. Left ventriculography.   PROCEDURE NOTE:  After informed consent was obtained, the right groin  was sterilely prepped and draped.  The right coronary artery was locally  anesthetized with 1% lidocaine.  The right common femoral artery was  entered using Seldinger technique and a 5-French arterial sheath was  placed in the artery.  The left coronary artery was engaged using the 5-  [place] JL-4 catheter.  Right coronary artery was engaged using a 5-  [place] JR-4 catheter and the left ventricle was entered using the 5-  French angled pigtail catheter.  There were no complications.   FINDINGS:  1. Hemodynamics:  Aorta 120/73, LV 113/0/5.  2. Left ventriculography:  LV EF was estimated as 60%.  There were no      wall motion abnormalities in the RAO view.  There was no mitral      regurgitation.  3. Coronary angiography:  The coronary system was right dominant.      There was no significant disease in the RCA.  The left main was      clear of significant disease.  There was a stent in the proximal      LAD.  The proximal LAD stent is patent with minimal approximately      20% in-stent restenosis.  The remainder of the LAD is without      significant coronary artery disease.  The circumflex does not have      significant coronary artery disease.   ASSESSMENT AND PLAN:  This patient probably had noncoronary chest pain.  We will continue with  aggressive medical management of his known  coronary artery disease.      [name], MD  Electronically Signed     DM/MEDQ  D:  04/12/2008  T:  04/12/2008  Job:  [id number]   cc:   [name]

## 2010-07-08 NOTE — Assessment & Plan Note (Signed)
[facility]                            CARDIOLOGY OFFICE NOTE   MAXAMILLIAN, TIENDA                        MRN:          [id number]  DATE:11/01/2006                            DOB:          Dec 25, 1955    Primary care is by Dr. Suzie Portela at [facility].   REASON FOR VISIT:  Routine cardiac follow-up.   HISTORY OF PRESENT ILLNESS:  Ms. Loflin was seen back in March.  He  denies having any typical exertional chest pain.  He has been doing a  fair bit of physical labor and has had difficulties with back pain.  He  has apparently been seen by a neurosurgeon although there is no surgery  anticipated at this time.  He continues on the medications outlined  below except that he has not been taking Plavix with any regularity.  Previous lipids in March revealed an LDL of 3, HDL of 43, and normal  liver function tests.  His electrocardiogram today is normal showing  sinus bradycardia with early repolarization.   REVIEW OF SYSTEMS:  As described in the history of present illness.  No  bleeding problems noted.   MEDICATIONS:  1. Aspirin 325 mg p.o. daily.  2. Crestor 5 mg p.o. daily.  3. Hydrocodone p.r.n.  4. Protonix 40 mg p.r.n.  5. Saw palmetto p.r.n.  6. Flexeril 10 mg p.o. q.h.s. p.r.n.  7. Xanax as directed.  8. Sublingual nitroglycerin p.r.n.   EXAMINATION:  Blood pressure 94/70, heart rate of 66.  Weight is 154  pounds.  The patient is comfortable, in no acute distress.  Examination of the neck reveals no elevated jugular venous pressure, no  bruits, no thyromegaly.  LUNGS:  Clear.  No labored breathing.  CARDIAC:  A regular rate and rhythm.  No rub, murmur or gallop.  ABDOMEN:  Soft, nontender.  EXTREMITIES:  No pitting edema.   IMPRESSION AND RECOMMENDATIONS:  1. Coronary artery disease, status post non-ST elevation myocardial      infarction, treated with bare metal stent placement in the proximal      left anterior descending in 2006.   The plan will be to continue his      present medications, although discontinue Plavix since he has not      been taking this with any regularity.  He is well out from his      event with stable symptoms.  We talked about the risk/benefit ratio      of making this change.  Otherwise, we will get follow-up liver and      lipid tests today to assess his control of hyperlipidemia on      Crestor.  I will plan to      see him back in the next 6 months.  2. Further plans to follow.     Satira Sark, MD  Electronically Signed    SGM/MedQ  DD: 11/01/2006  DT: 11/02/2006  Job #: [id number]   cc:   Suzie Portela, MD
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
## 2010-07-11 NOTE — Discharge Summary (Signed)
NAME:  Arthur Francis, SPIRA NO.:  [id number]   MEDICAL RECORD NO.:  [id number]          PATIENT TYPE:  INP   LOCATION:  6283                         FACILITY:  [place]   PHYSICIAN:  Cherene Altes, M.D.DATE OF BIRTH:  03/20/1955   DATE OF ADMISSION:  10/30/2004  DATE OF DISCHARGE:  11/04/2004                                 DISCHARGE SUMMARY   CHIEF COMPLAINT:  1.  Chest pain.      1.  Non-ST segment elevation myocardial infarction.      2.  A 95% stenosis of the proximal left anterior descending.      3.  Status post percutaneous transluminal coronary angioplasty with sent          placement per [facility].      4.  Preserved ejection fraction/left ventricular function.  2.  Tobacco abuse.  Counseled on discontinuation.   DISCHARGE MEDICATIONS:  1.  Aspirin 325 mg p.o. daily.  2.  Plavix 75 mg p.o. daily.  3.  Nitroglycerin 0.4 mg as needed for chest pain.  4.  Zocor 40 mg at bedtime daily.   FOLLOW UP:  Dr. Arlina Robes office will call the patient for follow-up in the  cardiology office.  The patient is advised that he should reconnect with  [facility] for ongoing medical issues.   CONSULTATIONS:  [facility].   PROCEDURE:  Cardiac catheterization.  Please see cardiac catheterization  note.   HISTORY OF PRESENT ILLNESS:  Please see full dictated H&P per Dr. Marye Round  labeled #[id number].   HOSPITAL COURSE:  Mr. Arthur Francis. Scurlock is a 55 year old gentleman with a  history of tobacco abuse and a very strong family history for coronary  artery disease.  He presented to the hospital with complaints of chest  pressure and diaphoresis.  History was concerning enough for unstable angina  that the patient was admitted to the hospital.  Medical management was  initiated.  Serial cardiac enzymes revealed mildly elevated troponin but  normal CK and CK-MB.  Serial 12-lead EKGs did not reveal acute ST segment  elevation or other findings suggestive of acute MI.   Because of the  patient's very strong family history, cardiology consultation was carried  out and Cardiolite was performed.  Cardiolite did, in fact, suggest anterior  wall ischemia.  As a result, a full cardiac catheterization was carried out.  This revealed  a proximal 95% LAD stenosis.  This was corrected with PTCA  and a stent was placed.  In the postoperative period, the patient was  stable.  He was clear for discharge by cardiology with addition of Zocor,  Plavix and aspirin to his medical regimen.  No other medical issues  remained.  Patient was educated on the importance of low fat cardiac diet  and cardiac rehab.   On November 04, 2004, patient was discharged home with follow-up as  detailed above.      Cherene Altes, M.D.  Electronically Signed     JTM/MEDQ  D:  11/04/2004  T:  11/04/2004  Job:  [id number]

## 2010-07-11 NOTE — H&P (Signed)
NAME:  Arthur Francis, Arthur Francis NO.:  [id number]   MEDICAL RECORD NO.:  [id number]          PATIENT TYPE:  INP   LOCATION:  1828                         FACILITY:  [place]   PHYSICIAN:  Edythe Lynn, M.D.       DATE OF BIRTH:  18-Mar-1955   DATE OF ADMISSION:  10/30/2004  DATE OF DISCHARGE:                                HISTORY & PHYSICAL   CHIEF COMPLAINT:  Chest pain.   HISTORY OF PRESENT ILLNESS:  [name] is a 55 year old Caucasian man  without any past medical history, who is smoking one pack a day of  cigarettes, with a family history of early coronary artery disease, who  comes into the emergency room for an episode of chest pain.  He reports that  for the past three days he has been having brief episodes of chest pressure  associated with diaphoresis and feeling out of sorts that last for about  three to four minutes.  The episodes are not brought on by effort and the  pain does not radiate.  He does not have any of the episodes while he is  asleep.  On the night of admission he had an episode of about four to five  minutes of heavy chest pressure, diaphoresis and feeling really weak.   PAST MEDICAL HISTORY:  He had the removal of some enlarged lymph nodes about  six years ago.  No other medical problems.   SOCIAL HISTORY:  The patient smokes one pack a day of tobacco.  Does not  drink alcohol.  Works in [employer].  He has a 91 year old son.   FAMILY HISTORY:  His mother is alive at age 43, healthy.  His father died  with congestive heart failure.  He had his first myocardial infarction when  he was age 54.  Also his father had a coronary artery bypass graft surgery.  He has a brother who has a stent placed.  He has two other brothers who have  diabetes and he has a sister who has diabetes and three other sisters who  are healthy.   REVIEW OF SYSTEMS:  He wears glasses and is covered in tattoos.  All other  systems as per the HPI.  The other systems are  negative.   PHYSICAL EXAMINATION:  VITAL SIGNS:  Upon admission shows a temperature of  98.1 degrees, blood pressure 108/60, heart rate 60, respirations 20,  saturation 99% on room air.  GENERAL:  He is robust, well-built, in no acute distress.  HEENT:  Eyes:  Pupils equal, round, reactive to light and accommodation.  Conjunctivae pink.  Ears/Nose/Throat:  Good hearing.  Sinuses clear.  Excellent mentation.  NECK:  Without jugular venous distention, no carotid bruits, no thyromegaly.  Trachea is in the midline.  LUNGS:  Clear to auscultation bilaterally without rhonchi or crackles.  CARDIOVASCULAR:  Bradycardic, regular, without murmurs, rubs or gallops.  ABDOMEN:  Soft, nontender, non-distended.  Bowel sounds present.  No  hepatosplenomegaly.  No rebound tenderness.  No guarding.  NODES:  There is no palpable lymphadenopathy.  MUSCULOSKELETAL:  He has  good bulk and tone.  Moves all four extremities  without __________.  SKIN:  No rashes.  NEUROLOGIC:  Cranial nerves II-XII  intact.  Deep tendon reflexes symmetric.  Sensation intact.  Strength 5/5 in all four extremities.  PSYCHIATRIC:  Good orientation, memory and affect.   LABORATORY DATA:  At the time of admission showed a portable chest x-ray  with hyper-inflation, nothing acute.   An electrocardiogram with sinus bradycardia and some minimal ST elevation in  lead II, III and aVF.  No ST-T changes otherwise.   Cardiac markers:  CK, MB, myoglobin, troponin I normal x2.  Creatinine 0.4,  sodium 140, potassium 4, chloride 109, bicarbonate 29, BUN 15, glucose 89.  Hemoglobin 15.   ASSESSMENT/PLAN:  1.  Chest pain in a 55 year old Caucasian man with some risk factors:  The      plan is to admit this patient to the hospital and rule out for an acute      myocardial infarction.  Monitor the cardiac enzymes.  Monitor the      electrocardiogram.  He will need a further risk stratification with a      fasting lipid panel and probable  stress test.  For this we will consult      cardiology in the morning.  2.  Tobacco abuse:  Will consult against further tobacco use.  The patient      is thinking about quitting.  He is refusing a nicotine patch for now.  3.  Bradycardia:  This could be secondary to the patient being just      physically fit, but will also check a TSH to rule out hypothyroidism.           ______________________________  Edythe Lynn, M.D.     SL/MEDQ  D:  10/31/2004  T:  10/31/2004  Job:  [id number]

## 2010-07-11 NOTE — Cardiovascular Report (Signed)
NAME:  Arthur Francis, BULMAN NO.:  [id number]   MEDICAL RECORD NO.:  [id number]          PATIENT TYPE:  INP   LOCATION:  6533                         FACILITY:  [place]   PHYSICIAN:  Eustace Quail, M.D. [facility] DATE OF BIRTH:  1955-10-25   DATE OF PROCEDURE:  11/03/2004  DATE OF DISCHARGE:                              CARDIAC CATHETERIZATION   CLINICAL HISTORY:  Mr. 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  He has been having  chest pain for a few weeks and was admitted with chest pain felt to  represent unstable angina.  He was seen in consultation by Dr. Dannielle Burn, who  ordered a Cardiolite scan which showed anterior ischemia.  He was scheduled  for evaluation with angiography today.   PROCEDURE:  The procedure was performed via the right femoral artery using  arterial sheath and 6-French preformed coronary catheters.  A femoral  arterial puncture was performed and Omnipaque contrast was used.  After  completion of the diagnostic study, we made a decision to proceed with  intervention on the left anterior descending artery.   The patient was given an Angiomax bolus and infusion, and was given 600 mg  of Plavix.  We used a 6-French Q4 guiding catheter with sideholes and a  Prowater wire.  We crossed the lesion in the proximal LAD with the wire  without difficulty.  We predilated with a 2.5 x 20-mm Maverick, performing 1  inflation up to 8 atmospheres for 30 seconds.  We then deployed a 3.0 x 20-  mm Liberte, deploying this with 1 inflation of 14 atmospheres for 30  seconds.  We then post-dilated with a 3.25 x 50-mm Quantum Maverick,  performing 2 inflations up to 16 atmospheres for 30 seconds.  Final  diagnostic studies were then performed through the guiding catheter.  The  patient tolerated the procedure well and left the laboratory in satisfactory  condition.   RESULTS:  The aortic pressure was 120/69 with a  mean of 90.  The left  ventricular pressure was 120/13.   Left main coronary artery:  The left main coronary artery was free of  significant disease.   Left anterior descending artery:  The left anterior descending artery gave  rise to 3 diagonal branches and 1 septal perforator.  There was 95% stenosis  which was fairly long and segmental before the first septal perforator.   Circumflex artery:  The circumflex artery gave rise to 2 small marginal  branches, a large marginal branch and 3 small posterolateral branches.  These vessels were free of significant disease.   Right coronary artery:  The right coronary artery is a moderately large  vessel that gave rise to 2 right ventricular branches, a posterior  descending branch and 2 posterolateral branches.  There was 30% narrowing in  the proximal right coronary artery.   LEFT VENTRICULOGRAM:  The left ventriculogram performed in the RAO  projection showed good wall motion with no areas of hypokalemia.  The  estimated ejection fraction was 60%.  Following stenting of the lesion in the proximal LAD, the stenosis improved  from 95% to 0%.   CONCLUSION:  1.  Coronary artery disease with 95% stenosis in the proximal left anterior      descending artery, no significant obstruction in the circumflex artery,      30% narrowing in the proximal right coronary artery and normal left      ventricular function with an estimated ejection fraction of 60%.  2.  Successful percutaneous coronary intervention of the lesion in the      proximal left anterior descending using a bare-metal stent with      improvement in the percentage in area of narrowing from 95% to 0%.   DISPOSITION:  The patient was returned to the post-angioplasty unit for  further observation.  We chose the bare-metal stent because of concern about  compliance with long-term Plavix.           ______________________________  Eustace Quail, M.D. [facility]     BB/MEDQ  D:   11/03/2004  T:  11/04/2004  Job:  [id number]   cc:   Ernestine Mcmurray, M.D. [facility]  [address]  [address] [place] [address]   [facility]   [facility]iop[facility]

## 2010-07-11 NOTE — Assessment & Plan Note (Signed)
[place]                            CARDIOLOGY OFFICE NOTE   KHAMANI, FAIRLEY                        MRN:          [id number]  DATE:04/30/2006                            DOB:          [birth year]    PRIMARY CARE:  Dr. Suzie Portela at [facility].   REASON FOR VISIT:  Follow up cardiac status.   HISTORY OF PRESENT ILLNESS:  I saw Mr. Corvin back in December.  His  history is detailed in my previous note.  He continues to deny any  significant problems of breathlessness or angina.  He can recall 1 brief  fleeting episode of pin-like chest pain that occurred 2 weeks ago that  sounds very atypical.  His electrocardiogram is stable today showing  sinus rhythm with repolarization abnormalities.  I reviewed his  medications, which are stable, and he states that he is tolerating  Crestor.  He did have lipids and liver function tests obtained back in  December, which showed an AST of 24, ALT of 41, total cholesterol 130,  HDL 44, LDL 74, and triglycerides 57.  His total CK was 146 at that  time.  He does state that he bleeds very easily when he cuts himself or  has an abrasion, on both aspirin and Plavix.   ALLERGIES:  CODEINE.   PRESENT MEDICATIONS:  1. Aspirin 325 mg p.o. daily.  2. Plavix 75 mg p.o. daily.  3. Crestor 5 mg p.o. daily.  4. Hydrocodone 5/500 mg p.o. q.3 to 4 hours.  5. Protonix 40 mg p.o. daily.  6. Saw palmetto.  7. Xanax.   REVIEW OF SYSTEMS:  As described in the history of present illness.   EXAMINATION:  Blood pressure is 100/58, heart rate is 48, weight is 157  pounds.  This is a thin male in no acute distress.  HEENT:  Conjunctivae and eyelids normal.  The patient is wearing  glasses.  Oropharynx is clear.  Somewhat poor dentition.  NECK:  Supple.  No elevated jugular venous pressure.  No bruits.  LUNGS:  Clear without labored breathing.  CARDIAC:  Regular rate and rhythm.  No loud murmur.  No pericardial rub  or S3  gallop.  ABDOMEN:  Soft, nontender.  No bruits.  EXTREMITIES:  No pitting edema.  SKIN:  Warm and dry.  Multiple tattoos.  Pulses are 2+.  MUSCULOSKELETAL:  No kyphosis is noted.   IMPRESSION/RECOMMENDATIONS:  1. Coronary artery disease status post non-ST-elevation myocardial      infarction, treated with bare metal stent placement to the proximal      left anterior descending in 2006.  This patient has remained on      Plavix since that time and does state that he has had some easy      bleeding when he cuts himself or has an abrasion.  He would like to      take his Plavix every other day.  We even discussed discontinuing      it at this point.  He will otherwise continue on aspirin and his  remaining medications, and I will plan to see him back over the      next 6 months.  2. In terms of his hyperlipidemia, I would like a followup lipid      profile and liver function tests given his prior abnormalities,      documented through blood work at [facility].      He is not having any myalgias at this time.  Seems to be tolerating      low-dose Crestor.     Satira Sark, MD  Electronically Signed    SGM/MedQ  DD: 04/30/2006  DT: 04/30/2006  Job #: [id number]   cc:   Dr. Suzie Portela

## 2010-07-11 NOTE — Assessment & Plan Note (Signed)
[place]                            CARDIOLOGY OFFICE NOTE   Arthur Francis, Arthur Francis                        MRN:          [id number]  DATE:01/25/2006                            DOB:          09-Sep-1955    PRIMARY CARE PHYSICIAN:  Arthur Francis at [facility].   REASON FOR VISIT:  Cardiac followup.   HISTORY OF PRESENT ILLNESS:  I saw Arthur Francis for the first time back in  June. He has a history of non-ST elevation myocardial infarction treated  with bare metal stent placement to the proximal left anterior descending  in September 2006 by Dr. Olevia Francis. He continues to deny any problems with  exertional angina or limited dyspnea on exertion. He walks and even jogs  some and is able to do 50 pushups at a time without symptoms. He had  several questions today about general activities and exercise and  brought in some blood work obtained apparently through some type of  study that he was interested in pursuing through [facility] regarding lipid management. I do not have all the details, but  the labs that he shows were abnormal. His LDL cholesterol actually  looked fairly good ranging from 49 to 55 on three occasions, although it  was noted that his total CK level increased from 98 to 2195 noted on the  8th of October with an AST increase from 32 to 104. He tells me that he  was not actually enrolled in the trial at that time and was taking  either Zocor or generic Simvastatin which he had actually been on for at  least a year. In the interim he apparently has been changed to Crestor.  He tells me that no other blood work or followup has been pursued. In  retrospect he remembers having sore legs and some arm discomfort around  that time when his blood work was abnormal and had been jogging some,  thinking that he had pulled a muscle. Denies having any frank chest pain  or shortness of breath at that time. His electrocardiogram today  shows  sinus bradycardia with early repolarization changes and prominent T  waves as noted previously in June without any significant change.   ALLERGIES:  CODEINE.   PRESENT MEDICATIONS:  1. Plavix 75 mg p.o. daily.  2. Aspirin 325 mg p.o. daily.  3. Xanax 5 mg p.o. t.i.d.  4. Crestor 5 mg p.o. daily.  5. Hydrocodone 5/500 mg q.4 h.  6. Protonix 40 mg p.o. p.r.n.  7. Saw palmetto.  8. He also has p.r.n. supplemental nitroglycerin, although he has not      had to use this.   REVIEW OF SYSTEMS:  As described in History of Present Illness.   PHYSICAL EXAMINATION:  VITAL SIGNS:  Blood pressure 102/60, heart rate  52.  GENERAL:  The patient is comfortable today with no specific complaints.  HEENT:  Conjunctivae normal, oropharynx clear.  NECK:  Supple without elevated jugular venous pressure without bruits.  No thyromegaly is noted.  LUNGS:  Clear without labored breathing at rest.  CARDIAC:  Reveals regular rate and rhythm without murmur, rub or gallop.  ABDOMEN:  Soft, nontender. Normoactive bowel sounds.  EXTREMITIES:  Show no significant clubbing, cyanosis or edema. Distal  pulses are 2+.  SKIN:  No ulcerative changes are noted.  MUSCULOSKELETAL:  No kyphosis noted.   IMPRESSION/RECOMMENDATIONS:  1. Known history of coronary artery disease status post non-ST      elevation myocardial infarction treated with bare mental stent      placement to the proximal left anterior descending in September      2006. He, otherwise, had mild atherosclerosis with normal ejection      fraction and is not having any exertional symptoms. Reportedly at      very reasonable levels of activity. I have asked him to continue      his present medications which will now also include Plavix. He is      obtaining this through an assistance program and has been      tolerating it well.  2. History of hyperlipidemia although with good LDL control based on      available information. I note on blood  work described above a      significantly elevated total CK as well as an increase in AST back      in October. This was obtained through [facility], and I am not certain that he has had any followup for      this since then. He did have apparently some muscle soreness at      that time although no chest pain, and at this point is not      complaining of these symptoms. He was on either Zocor or generic      Simvastatin at the time of the lab work abnormalities and is now on      Crestor. For the time being I have asked him to hold Crestor so      that we can follow up on lipids, liver function tests, and total CK      level. Based on this data, we can decide how to proceed. It is      possible that this was related to either Zocor or Simvastatin.      Further plans to follow.     Satira Sark, MD  Electronically Signed    SGM/MedQ  DD: 01/25/2006  DT: 01/26/2006  Job #: [id number]   cc:   Arthur Francis, M.D.

## 2010-07-11 NOTE — Consult Note (Signed)
NAME:  Arthur Francis, ORIHUELA NO.:  [id number]   MEDICAL RECORD NO.:  [id number]          PATIENT TYPE:  INP   LOCATION:  6761                         FACILITY:  [place]   PHYSICIAN:  Ernestine Mcmurray, M.D. LHCDATE OF BIRTH:  [birth year]   DATE OF CONSULTATION:  11/02/2004  DATE OF DISCHARGE:                                   CONSULTATION   PRIMARY CARE PHYSICIAN:  [facility].   CARDIOLOGIST:  Festus Barren, M.D.   REASON FOR CONSULTATION:  Evaluation of chest pain.   HISTORY OF PRESENT ILLNESS:  The patient is a 55 year old white male with no  know history of coronary artery disease.  The patient reported chest pain on  Tuesday with exertion, intermittent for 4 or 5 separate episodes, relieved  with rest.  He reports initial pain started at bedtime and resolved on  Wednesday.  On Thursday in the evening he has worsening symptoms with  radiation of his pain down both arms.  He reported 9/10 pain which did not  appear to be pleuritic in nature.  He had also shortness of breath and  nausea associated.  He was picked up by EMS at home and brought to [facility].  Initial troponins were elevate on admission to [place], the patient was scheduled for exercise Cardiolite  study which shows ischemia anteroseptal wall.   The patient currently is pain free. He has no shortness of breath,  orthopnea, PND, palpitations, or syncope.   ALLERGIES:  CODEINE.   MEDICATIONS:  No prior medications but currently heparin, Protonix, and  aspirin.   PAST MEDICAL HISTORY:  1.  Negative for diabetes, hypertension, dyslipidemia, but positive family      history and tobacco use.  2.  Chronic back pain.  3.  History of prostatitis.  4.  History of eye surgery.  5.  History of epidural injection.   SOCIAL HISTORY:  The patient lives in [place] with her son. Smoked  approximately 30 years, quit on admission.  The patient is a Biomedical engineer.  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   FAMILY HISTORY:  Mother died at age of 37 and father at age 2 from  congestive heart failure.  He has a brother with myocardial infarction and  stent.   REVIEW OF SYSTEMS:  No fever or chills.  No headache or sore throat.  No  vision or hearing loss.  Positive for tattoos.  Positive for chest pain,  shortness of breath.  Denies exertional orthopnea, PND, or edema.  No pain  or dysuria.  Positive for anxiety.  Positive for arthralgias and back pain.  No nausea or vomiting.  No polyuria or polydipsia.  All other systems are  negative.   PHYSICAL EXAMINATION:  VITAL SIGNS: Blood pressure 115/71, heart rate 57  beats per minute, O2 saturation 98% on room air.  GENERAL:  White male in no apparent distress.  HEENT:  Normocephalic and atraumatic.  PERRLA.  EOMI.  NECK:  Supple.  HEART:  Regular rate and rhythm.  Normal S1 and S2.  No S3 or S4.  LUNGS: Clear bilaterally, somewhat diminished at the bases.  SKIN:  No rash or lesion.  ABDOMEN:  Soft, nontender.  GU/RECTAL:  Deferred.  EXTREMITIES:  No cyanosis, clubbing, or edema.  MUSCULOSKELETAL: No joint deformities.  NEUROLOGIC:  The patient is alert and oriented, grossly nonfocal.   Chest x-ray shows bronchitic changes but no acute abnormalities.   EKG: Sinus bradycardia with heart rate of 49, repolarization with normal  variant.   Laboratory work: Hemoglobin 14.4, hematocrit 42.1, white count 7.51, 168.  Total cholesterol 148, triglycerides 95, HDL 3, LDL 93.  Troponin 0.16,  0.08, then 0.04.   PROBLEMS:  1.  Substernal chest pain with typical features.      1.  Positive cardiac enzymes.      2.  Negative Electrocardiogram.      3.  Multiple cardiac risk factors to include history of cocaine use.  2.  Dyslipidemia.  3.  Tobacco use.  4.  Abnormal Cardiolite stress study with anteroseptal ischemia, ejection      fraction 30%.    PLAN:  1.  I discussed with the patient the need to proceed with cardiac      catheterization.  He has agreed to proceed.  2.  Agree with aspirin and heparin in the interim.  Start beta blocker if      heart rate greater than 60 beats per minute.  3.  Risks and benefits of the procedure discussed.  The patient is willing      to proceed.      Ernestine Mcmurray, M.D. [facility]  Electronically Signed     GED/MEDQ  D:  11/02/2004  T:  11/02/2004  Job:  [id number]   cc:   [facility]

## 2010-07-24 ENCOUNTER — Encounter: Payer: Self-pay | Admitting: Cardiology

## 2010-08-08 ENCOUNTER — Ambulatory Visit (INDEPENDENT_AMBULATORY_CARE_PROVIDER_SITE_OTHER): Payer: Medicare Other | Admitting: Cardiology

## 2010-08-08 ENCOUNTER — Encounter: Payer: Self-pay | Admitting: Cardiology

## 2010-08-08 DIAGNOSIS — I2510 Atherosclerotic heart disease of native coronary artery without angina pectoris: Secondary | ICD-10-CM

## 2010-08-08 DIAGNOSIS — E785 Hyperlipidemia, unspecified: Secondary | ICD-10-CM

## 2010-08-08 DIAGNOSIS — Z01818 Encounter for other preprocedural examination: Secondary | ICD-10-CM

## 2010-08-08 DIAGNOSIS — F17200 Nicotine dependence, unspecified, uncomplicated: Secondary | ICD-10-CM

## 2010-08-08 MED ORDER — BUPROPION HCL ER (SR) 150 MG PO TB12: 150.0000 mg | ORAL_TABLET | Freq: Two times a day (BID) | ORAL | Status: DC

## 2010-08-08 MED ORDER — HYDROCHLOROTHIAZIDE 25 MG PO TABS: 25.0000 mg | ORAL_TABLET | Freq: Every day | ORAL | Status: DC

## 2010-08-08 NOTE — Patient Instructions (Addendum)
Take Wellbutrin to help you stop smoking. Take 120m daily for 3 days then increase to 1564mtwice a day. You can take this for a total of 90 days.   Your physician wants you to follow-up in: 6 months with Dr McAundra Dubin(December 2012).You will receive a reminder letter in the mail two months in advance. If you don't receive a letter, please call our office to schedule the follow-up appointment.

## 2010-08-10 DIAGNOSIS — F17200 Nicotine dependence, unspecified, uncomplicated: Secondary | ICD-10-CM | POA: Insufficient documentation

## 2010-08-10 DIAGNOSIS — Z01818 Encounter for other preprocedural examination: Secondary | ICD-10-CM | POA: Insufficient documentation

## 2010-08-10 NOTE — Assessment & Plan Note (Signed)
He has restarted smoking.  He wants a pharmacological aide to quit.  I do not think that he would be a good Chantix candidate with CAD and depression.  I will let him try Wellbutrin.
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
## 2010-08-10 NOTE — Assessment & Plan Note (Signed)
If patient needs to undergo back surgery, I think that he would be stable for it without any further testing.  Negative myoview in 2011, no significant cardiac symptoms, and he is able to exert himself to at least 4 METS.  He should continue beta blocker peri-operatively.

## 2010-08-10 NOTE — Assessment & Plan Note (Signed)
Goal LDL < 70.  Lipids recently done by Dr. Maudie Mercury, will try to get a copy.

## 2010-08-10 NOTE — Assessment & Plan Note (Signed)
Prior BMS to LAD.  Last myoview 1/11 showed no ischemia or infarction.  No recent worrisome symptoms.  Continue ASA, statin, ACEI, beta blocker.

## 2010-08-14 IMAGING — US US SCROTUM
1 series · 14 of 25 positions shown · non-contrast
Comparison: None

CLINICAL DATA: Left testicular mass.  Evaluate for cyst.

ULTRASOUND OF SCROTUM
TECHNIQUE: Complete ultrasound examination of the testicles,
epididymis, and other scrotal structures was performed.

[Series 1: us scrotum · 0.08mm/px · 14 of 56 slices shown]
[im 1/56]
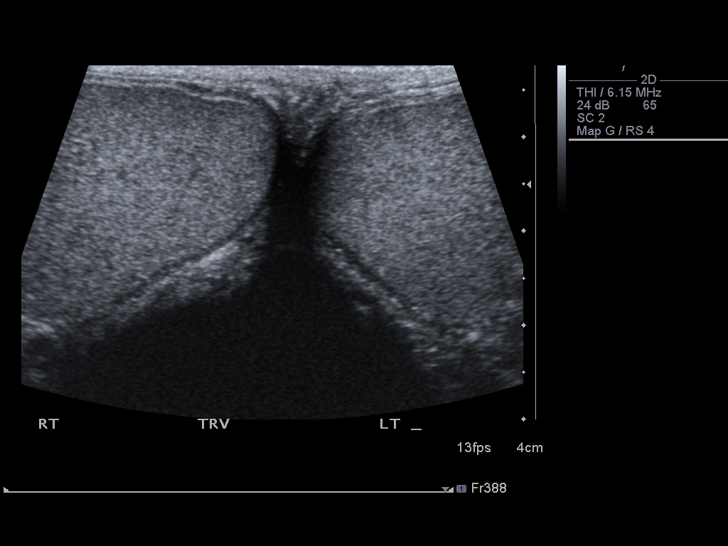
[im 5/56]
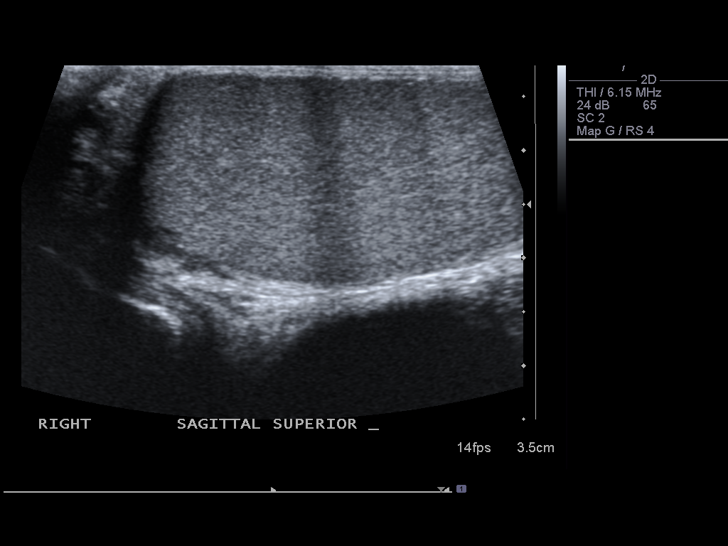
[im 10/56]
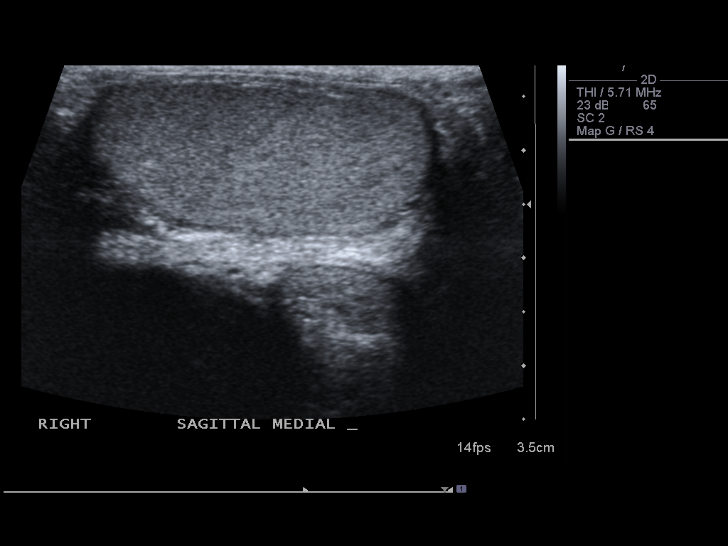
[im 14/56]
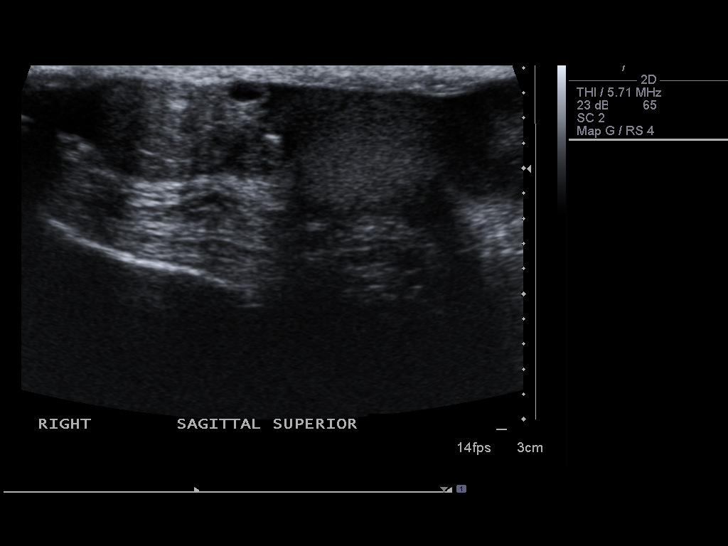
[im 19/56]
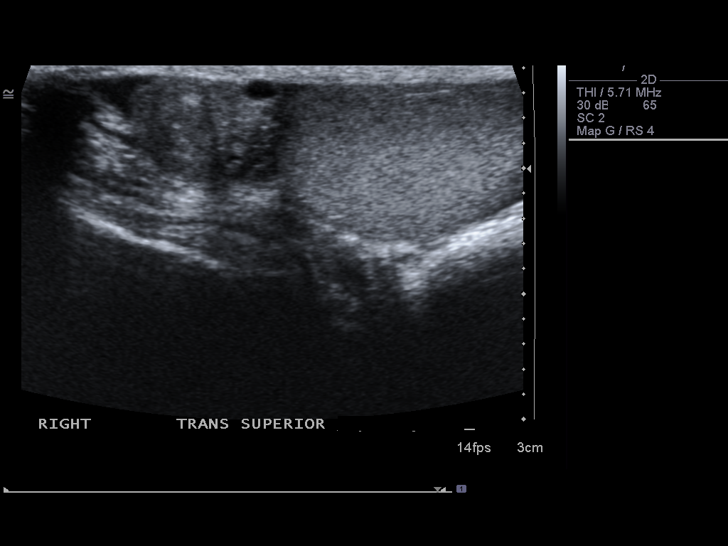
[im 21/56]
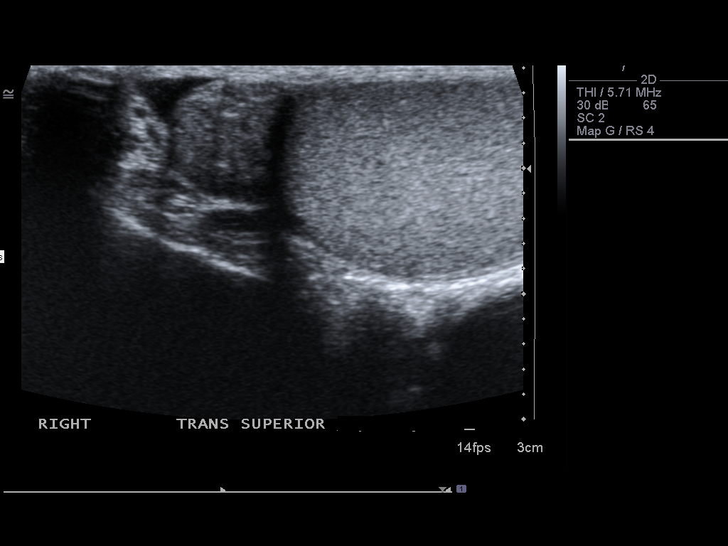
[im 26/56]
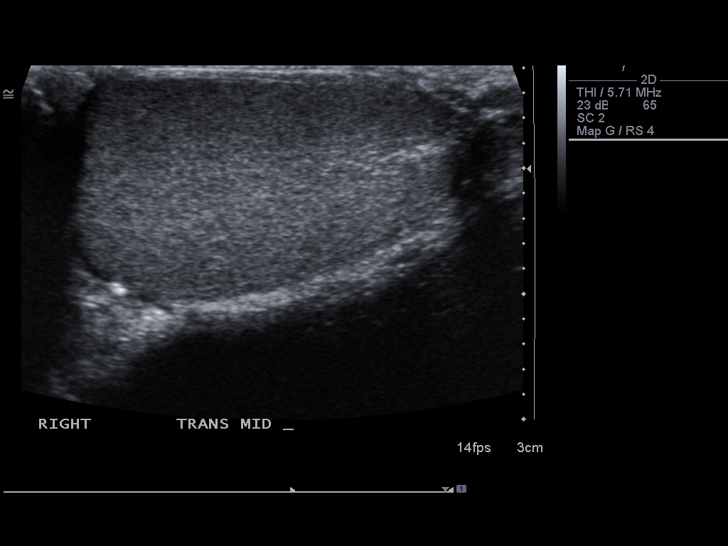
[im 30/56]
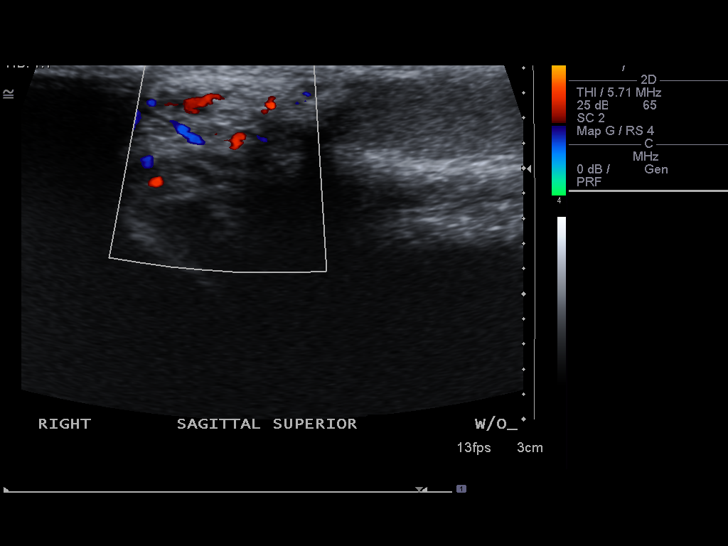
[im 35/56]
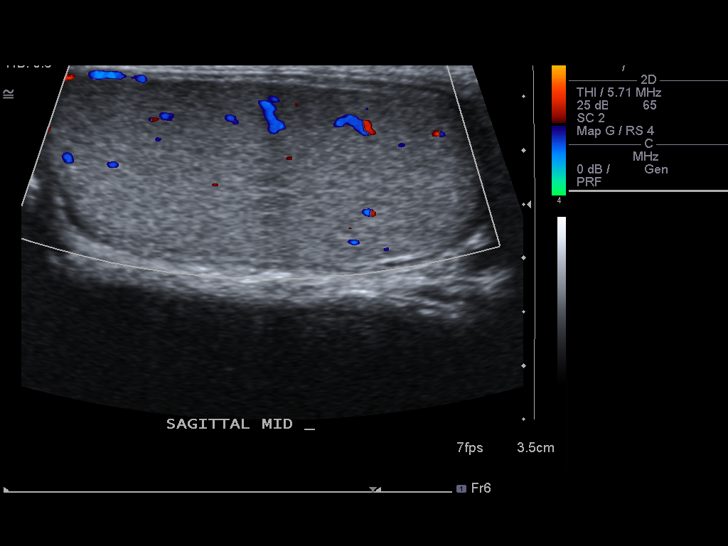
[im 37/56]
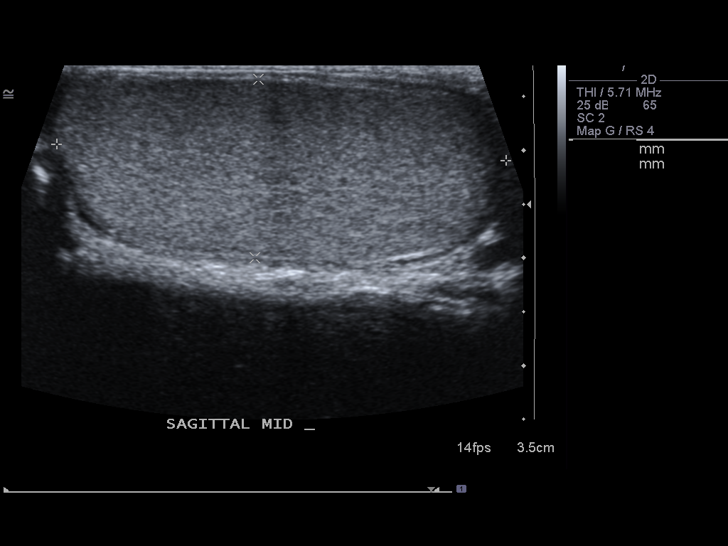
[im 42/56]
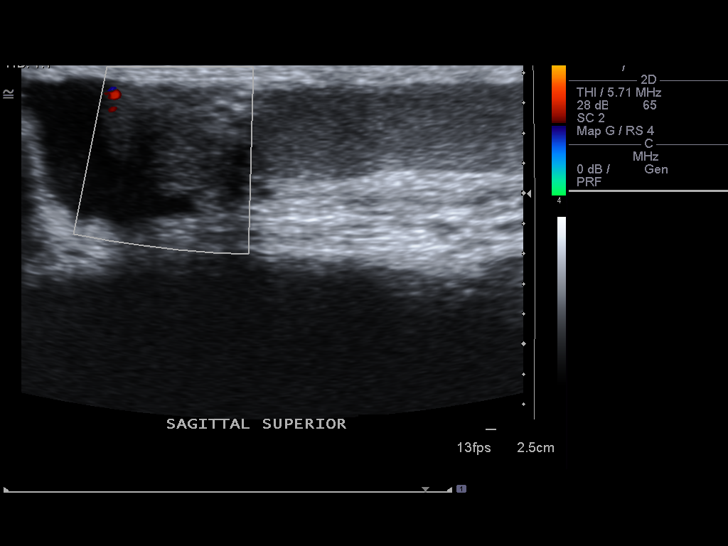
[im 46/56]
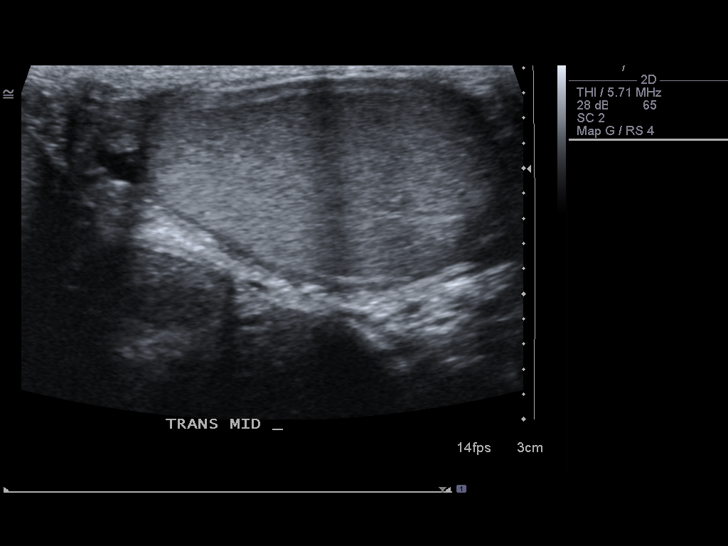
[im 51/56]
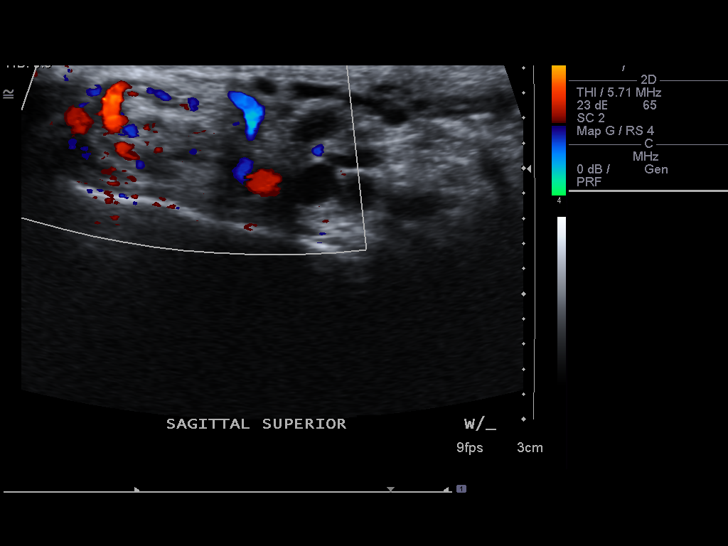
[im 56/56]
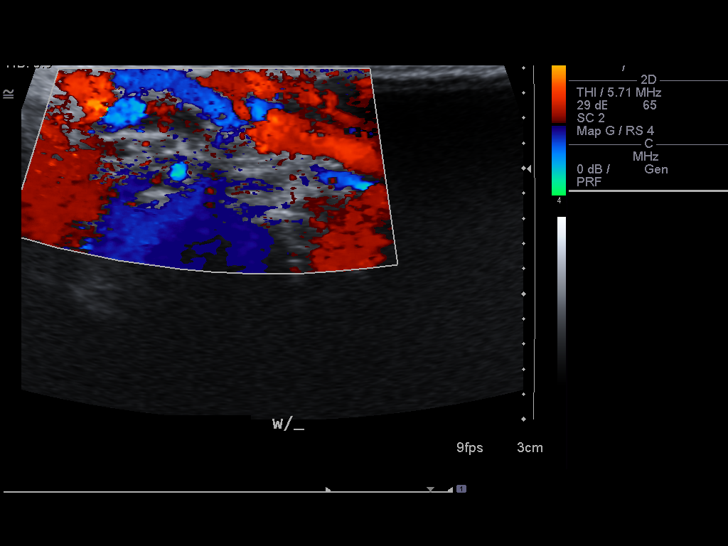

[14 of 25 positions shown; findings below may reference images not displayed]

FINDINGS: Right testicle measures 4.4 x 1.9 x 3.1 cm.  Left
testicle measures 4.2 x 1.7 x 2.9 cm.  Normal homogeneous
echotexture of the testicles.  Normal color Doppler flow
bilaterally. 3 x 2 x3 mm and 2 x 1 x 2 mm right epididymal cysts
versus spermatoceles. Normal left epididymis.  No hydrocele.  Left
varicocele.
IMPRESSION: Two small right epididymal cysts versus spermatoceles.  Left
varicocele.  Normal testicles.

## 2010-10-02 ENCOUNTER — Telehealth: Payer: Self-pay | Admitting: Cardiology

## 2010-10-02 NOTE — Telephone Encounter (Signed)
Topical Voltaren and Neurontin would be ok.

## 2010-10-02 NOTE — Telephone Encounter (Signed)
Murlean Hark PA , with [facility], left message on after hours requesting a rtn call from the nurse

## 2010-10-02 NOTE — Telephone Encounter (Signed)
Murlean Hark PA, from [facility], would like to clear pt to start on  topical Volteran gel and Neurontin low dose, for peripheral edema. Please call Beverlee Nims at [phone].

## 2010-10-02 NOTE — Telephone Encounter (Signed)
Left a message in the office to call back.

## 2010-10-03 NOTE — Telephone Encounter (Signed)
I called [facility] office back and left a message with Vikki Ports that Voltaren and Neurontin are ok for this patient per Dr. Aundra Dubin.

## 2010-10-06 ENCOUNTER — Telehealth: Payer: Self-pay | Admitting: Cardiology

## 2010-10-06 NOTE — Telephone Encounter (Signed)
Hilda Blades RN has question re pt meds.

## 2010-10-06 NOTE — Telephone Encounter (Signed)
Arthur Francis with pt now.  LM for her to call.

## 2010-10-09 ENCOUNTER — Telehealth: Payer: Self-pay | Admitting: *Deleted

## 2010-10-09 NOTE — Telephone Encounter (Signed)
Dr Renato Battles, PA, from [facility] calling to see if OK for Arthur Francis ( 05/24/2055) to start voltaren gel 4gm BID and gabapentin 310m to start with--possibility to titrate up---for pain?--please let uKoreaknow and i will phone back--thanks nt

## 2010-10-09 NOTE — Telephone Encounter (Signed)
Already left message somewhere about this but ok to use Voltaren gel and gabapentin.

## 2010-10-10 ENCOUNTER — Telehealth: Payer: Self-pay | Admitting: *Deleted

## 2010-10-10 NOTE — Telephone Encounter (Signed)
LMTCB  ONCE AGAIN THIS WAS THE THIRD ONE./CY

## 2010-10-20 ENCOUNTER — Telehealth: Payer: Self-pay | Admitting: *Deleted

## 2010-10-20 NOTE — Telephone Encounter (Signed)
LEFT SEVERAL MESSAGES MD OFF WILL CALL AGAIN IF AS QUESTIONS./CY

## 2010-10-20 NOTE — Telephone Encounter (Signed)
i called [facility] ([place] office) and explained that dr Aundra Dubin had OK ed pt taking gabapentin and voltaren gel --please don't call us again --they agreed--nt

## 2010-11-14 LAB — POCT HEMOGLOBIN-HEMACUE
Hemoglobin: 14.5
Operator id: 268271

## 2010-11-17 ENCOUNTER — Telehealth: Payer: Self-pay | Admitting: Cardiology

## 2010-11-17 DIAGNOSIS — I2510 Atherosclerotic heart disease of native coronary artery without angina pectoris: Secondary | ICD-10-CM

## 2010-11-17 NOTE — Telephone Encounter (Signed)
Pt wants refill metropolol 25 mg, crestor 40 mg, hydrochlorothiazide  To [facility] on [address]

## 2010-11-18 MED ORDER — METOPROLOL SUCCINATE ER 25 MG PO TB24: 25.0000 mg | ORAL_TABLET | Freq: Every day | ORAL | Status: DC

## 2010-11-18 MED ORDER — ROSUVASTATIN CALCIUM 40 MG PO TABS: 40.0000 mg | ORAL_TABLET | Freq: Every day | ORAL | Status: DC

## 2010-11-18 MED ORDER — HYDROCHLOROTHIAZIDE 25 MG PO TABS: 25.0000 mg | ORAL_TABLET | Freq: Every day | ORAL | Status: DC

## 2010-11-26 ENCOUNTER — Telehealth: Payer: Self-pay | Admitting: Cardiology

## 2010-11-26 NOTE — Telephone Encounter (Signed)
C/o feet swelling x 3-4 days. No chestpain.

## 2010-11-26 NOTE — Telephone Encounter (Signed)
Have him come to office to see me or Scott.

## 2010-11-26 NOTE — Telephone Encounter (Signed)
I talked with pt. Pt states his feet and legs have been swollen the last 3-4 days. Pt saw his pain management doctor today. Pt states he has gained 30 pounds in a month according to the scales at his pain management doctor.Marland Kitchen He denies increase in SOB. Pt states his PCP has given him a medication 3 days ago   to keep him from peeing at night but he doesn't know the name of the medication. I will forward to Dr Aundra Dubin for review.

## 2010-11-27 ENCOUNTER — Ambulatory Visit (INDEPENDENT_AMBULATORY_CARE_PROVIDER_SITE_OTHER): Payer: Medicare Other | Admitting: Physician Assistant

## 2010-11-27 ENCOUNTER — Encounter: Payer: Self-pay | Admitting: Physician Assistant

## 2010-11-27 DIAGNOSIS — R609 Edema, unspecified: Secondary | ICD-10-CM | POA: Insufficient documentation

## 2010-11-27 DIAGNOSIS — R0989 Other specified symptoms and signs involving the circulatory and respiratory systems: Secondary | ICD-10-CM

## 2010-11-27 DIAGNOSIS — R0683 Snoring: Secondary | ICD-10-CM

## 2010-11-27 DIAGNOSIS — R188 Other ascites: Secondary | ICD-10-CM | POA: Insufficient documentation

## 2010-11-27 DIAGNOSIS — I2510 Atherosclerotic heart disease of native coronary artery without angina pectoris: Secondary | ICD-10-CM

## 2010-11-27 LAB — BASIC METABOLIC PANEL
BUN: 14 mg/dL (ref 6–23)
CO2: 29 mEq/L (ref 19–32)
Chloride: 105 mEq/L (ref 96–112)
Creatinine, Ser: 1 mg/dL (ref 0.4–1.5)
Potassium: 4 mEq/L (ref 3.5–5.1)

## 2010-11-27 LAB — CBC WITH DIFFERENTIAL/PLATELET
Basophils Absolute: 0 10*3/uL (ref 0.0–0.1)
Eosinophils Relative: 0.5 % (ref 0.0–5.0)
HCT: 45.1 % (ref 39.0–52.0)
Lymphocytes Relative: 12.5 % (ref 12.0–46.0)
Monocytes Relative: 6.9 % (ref 3.0–12.0)
Neutrophils Relative %: 80 % — ABNORMAL HIGH (ref 43.0–77.0)
Platelets: 183 10*3/uL (ref 150.0–400.0)
WBC: 9.6 10*3/uL (ref 4.5–10.5)

## 2010-11-27 LAB — HEPATIC FUNCTION PANEL
AST: 37 U/L (ref 0–37)
Alkaline Phosphatase: 55 U/L (ref 39–117)
Bilirubin, Direct: 0 mg/dL (ref 0.0–0.3)
Total Bilirubin: 0.4 mg/dL (ref 0.3–1.2)

## 2010-11-27 LAB — TSH: TSH: 0.63 u[IU]/mL (ref 0.35–5.50)

## 2010-11-27 LAB — BRAIN NATRIURETIC PEPTIDE: Pro B Natriuretic peptide (BNP): 61 pg/mL (ref 0.0–100.0)

## 2010-11-27 MED ORDER — FUROSEMIDE 40 MG PO TABS: 40.0000 mg | ORAL_TABLET | Freq: Every day | ORAL | Status: DC

## 2010-11-27 MED ORDER — POTASSIUM CHLORIDE ER 10 MEQ PO TBCR: 10.0000 meq | EXTENDED_RELEASE_TABLET | Freq: Every day | ORAL | Status: DC

## 2010-11-27 NOTE — Assessment & Plan Note (Signed)
He knows he needs to quit.

## 2010-11-27 NOTE — Patient Instructions (Signed)
Please have lab work today and repeat in one week  Please stop your Hydrochlorothiazide and start Lasix (furosemide) 40 mg a day Start potassium chloride daily Continue all other medications as listed  Your physician has requested that you have an echocardiogram. Echocardiography is a painless test that uses sound waves to create images of your heart. It provides your doctor with information about the size and shape of your heart and how well your heart's chambers and valves are working. This procedure takes approximately one hour. There are no restrictions for this procedure.  You are being scheduled for an abdominal ultrasound.  Follow up with Dr Aundra Dubin on December 09, 2010 (per Dr Aundra Dubin)

## 2010-11-27 NOTE — Assessment & Plan Note (Signed)
Consider sleep study once edema evaluated and managed.

## 2010-11-27 NOTE — Assessment & Plan Note (Signed)
No angina.  Continue ASA and statin.

## 2010-11-27 NOTE — Telephone Encounter (Signed)
I talked with pt. I have given him an appt to see Margaret Pyle today at [facility].
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
## 2010-11-27 NOTE — Assessment & Plan Note (Signed)
Controlled.  

## 2010-11-27 NOTE — Assessment & Plan Note (Signed)
Suspect belly is swollen too.  He does have a h/o ETOH use.  Get abdominal ultrasound as well.  Labs as noted above.  Follow up in 1-2 weeks.

## 2010-11-27 NOTE — Assessment & Plan Note (Signed)
Etiology unclear.  I do not think he has CHF.  Differential includes:  Diastolic CHF, liver failure, thyroid disease, renal failure, nephrotic syndrome.  Will check 2D echo, BMET, BNP, CBC, TSH, LFTs, U/A.  Stop HCTZ.  Start Lasix 40 mg QD and K+ 10 mEq QD.  Repeat BMET in one week.  Follow up with Dr. Aundra Dubin in 1-2 weeks.  I discussed the patient with Dr. Aundra Dubin today.

## 2010-11-28 ENCOUNTER — Telehealth: Payer: Self-pay | Admitting: *Deleted

## 2010-11-28 LAB — URINALYSIS
Bilirubin Urine: NEGATIVE
Ketones, ur: NEGATIVE
Specific Gravity, Urine: 1.02 (ref 1.000–1.030)
Total Protein, Urine: NEGATIVE
pH: 7 (ref 5.0–8.0)

## 2010-11-28 NOTE — Telephone Encounter (Signed)
Made app for labs, pt informed of date, pt accepted.

## 2010-11-28 NOTE — Telephone Encounter (Signed)
Message copied by Jonathon Jordan on Fri Nov 28, 2010  4:42 PM ------      Message from: [place], [place] T      Created: Fri Nov 28, 2010  1:49 PM       Labs ok      Continue Lasix and K+ and get repeat bmet in one week (should be scheduled)      Richardson Dopp, PA-C

## 2010-11-30 NOTE — Progress Notes (Signed)
?   Cirrhosis. Followup with me. Arthur Francis [employer]

## 2010-12-01 ENCOUNTER — Ambulatory Visit (HOSPITAL_COMMUNITY)
Admission: RE | Admit: 2010-12-01 | Discharge: 2010-12-01 | Disposition: A | Discharge status: Home / Self Care | Payer: Medicare Other | Source: Ambulatory Visit | Attending: Cardiology | Admitting: Cardiology

## 2010-12-01 ENCOUNTER — Other Ambulatory Visit: Payer: Self-pay | Admitting: Physician Assistant

## 2010-12-01 DIAGNOSIS — M7989 Other specified soft tissue disorders: Secondary | ICD-10-CM | POA: Insufficient documentation

## 2010-12-01 DIAGNOSIS — R1900 Intra-abdominal and pelvic swelling, mass and lump, unspecified site: Secondary | ICD-10-CM | POA: Insufficient documentation

## 2010-12-02 NOTE — Progress Notes (Signed)
No ascites

## 2010-12-04 ENCOUNTER — Ambulatory Visit (HOSPITAL_COMMUNITY): Payer: Medicare Other | Attending: Cardiology | Admitting: Radiology

## 2010-12-04 ENCOUNTER — Other Ambulatory Visit (INDEPENDENT_AMBULATORY_CARE_PROVIDER_SITE_OTHER): Payer: Medicare Other | Admitting: *Deleted

## 2010-12-04 DIAGNOSIS — E785 Hyperlipidemia, unspecified: Secondary | ICD-10-CM | POA: Insufficient documentation

## 2010-12-04 DIAGNOSIS — I252 Old myocardial infarction: Secondary | ICD-10-CM | POA: Insufficient documentation

## 2010-12-04 DIAGNOSIS — F1011 Alcohol abuse, in remission: Secondary | ICD-10-CM | POA: Insufficient documentation

## 2010-12-04 DIAGNOSIS — I2510 Atherosclerotic heart disease of native coronary artery without angina pectoris: Secondary | ICD-10-CM | POA: Insufficient documentation

## 2010-12-04 DIAGNOSIS — I10 Essential (primary) hypertension: Secondary | ICD-10-CM | POA: Insufficient documentation

## 2010-12-04 DIAGNOSIS — F17200 Nicotine dependence, unspecified, uncomplicated: Secondary | ICD-10-CM | POA: Insufficient documentation

## 2010-12-04 DIAGNOSIS — R188 Other ascites: Secondary | ICD-10-CM

## 2010-12-04 DIAGNOSIS — Z8249 Family history of ischemic heart disease and other diseases of the circulatory system: Secondary | ICD-10-CM | POA: Insufficient documentation

## 2010-12-04 DIAGNOSIS — R609 Edema, unspecified: Secondary | ICD-10-CM

## 2010-12-04 LAB — BASIC METABOLIC PANEL
BUN: 16 mg/dL (ref 6–23)
CO2: 28 mEq/L (ref 19–32)
GFR: 68.78 mL/min (ref >60.00)
Glucose, Bld: 91 mg/dL (ref 70–99)
Potassium: 5.6 mEq/L — ABNORMAL HIGH (ref 3.5–5.1)
Sodium: 137 mEq/L (ref 135–145)

## 2010-12-05 ENCOUNTER — Telehealth: Payer: Self-pay | Admitting: *Deleted

## 2010-12-05 ENCOUNTER — Other Ambulatory Visit (INDEPENDENT_AMBULATORY_CARE_PROVIDER_SITE_OTHER): Payer: Medicare Other | Admitting: *Deleted

## 2010-12-05 DIAGNOSIS — I2510 Atherosclerotic heart disease of native coronary artery without angina pectoris: Secondary | ICD-10-CM

## 2010-12-05 LAB — BASIC METABOLIC PANEL
BUN: 15 mg/dL (ref 6–23)
CO2: 31 mEq/L (ref 19–32)
Calcium: 9.2 mg/dL (ref 8.4–10.5)
Chloride: 102 mEq/L (ref 96–112)
Creatinine, Ser: 1.1 mg/dL (ref 0.4–1.5)

## 2010-12-05 NOTE — Telephone Encounter (Signed)
Spoke with pt and gave him results of BMP and need for repeat BMP today. He will come in for lab work around noon today.

## 2010-12-09 ENCOUNTER — Encounter: Payer: Self-pay | Admitting: Cardiology

## 2010-12-09 ENCOUNTER — Ambulatory Visit (INDEPENDENT_AMBULATORY_CARE_PROVIDER_SITE_OTHER): Payer: Medicare Other | Admitting: Cardiology

## 2010-12-09 DIAGNOSIS — I509 Heart failure, unspecified: Secondary | ICD-10-CM

## 2010-12-09 DIAGNOSIS — F17200 Nicotine dependence, unspecified, uncomplicated: Secondary | ICD-10-CM

## 2010-12-09 DIAGNOSIS — R609 Edema, unspecified: Secondary | ICD-10-CM

## 2010-12-09 DIAGNOSIS — I2510 Atherosclerotic heart disease of native coronary artery without angina pectoris: Secondary | ICD-10-CM

## 2010-12-09 DIAGNOSIS — E785 Hyperlipidemia, unspecified: Secondary | ICD-10-CM

## 2010-12-09 DIAGNOSIS — I5032 Chronic diastolic (congestive) heart failure: Secondary | ICD-10-CM

## 2010-12-09 MED ORDER — FUROSEMIDE 20 MG PO TABS: 20.0000 mg | ORAL_TABLET | Freq: Every day | ORAL | Status: DC

## 2010-12-09 MED ORDER — NICOTINE 21 MG/24HR TD PT24: 1.0000 | MEDICATED_PATCH | TRANSDERMAL | Status: AC

## 2010-12-09 NOTE — Assessment & Plan Note (Signed)
Cause of lower extremity edema is unclear.  Echo showed no significant abnormality.  BNP was not elevated.  He did not have proteinuria.  No LFT abnormalities.  Normal TSH.  Abdominal ultrasound did not show ascites (abdomen had appeared distended).  He had been drinking 3-4 beers/day for several months prior to presentation with edema.  This may have led to a significant sodium load and volume retention.  He has quit drinking completely.  Lower extremity edema has completely resolved with discontinuation of ETOH and use of Lasix.  I will decrease Lasix to 20 mg daily.

## 2010-12-09 NOTE — Assessment & Plan Note (Signed)
I again strongly encouraged him to quit smoking.  He wants to try nicotine patches.

## 2010-12-09 NOTE — Patient Instructions (Signed)
Decrease lasix(furosemide) to 41m daily--you can take 1/2 of a 456mtablet daily.  Use a nicotine patch to help you stop smoking.  Your physician recommends that you have  lab work today--lipid profile 414.01  428.32  Your physician wants you to follow-up in: 3 months with Dr McAundra Dubin( January 2013) You will receive a reminder letter in the mail two months in advance. If you don't receive a letter, please call our office to schedule the follow-up appointment.

## 2010-12-09 NOTE — Assessment & Plan Note (Signed)
No angina.  Continue ASA, Toprol XL, ACEI, and statin.
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
## 2010-12-09 NOTE — Assessment & Plan Note (Signed)
I will check lipids.  Goal LDL < 70.

## 2010-12-29 ENCOUNTER — Telehealth: Payer: Self-pay | Admitting: Cardiology

## 2010-12-29 NOTE — Telephone Encounter (Signed)
Pt calling re question re potassium cholride medication, does he need to continue to take it?

## 2010-12-29 NOTE — Telephone Encounter (Signed)
Spoke with pt, he is aware to cont potassium Fredia Beets

## 2011-01-06 ENCOUNTER — Telehealth: Payer: Self-pay | Admitting: Cardiology

## 2011-01-06 NOTE — Telephone Encounter (Signed)
Pt calling stating that his feet are swelling up again and pt doesn't know what to do about it. Pt said feet have been swollen for about four or five days now. Please return pt call to discuss further.

## 2011-01-06 NOTE — Telephone Encounter (Signed)
I talked with pt. Pt states for the last 4-5 days his feet and legs have been swollen. He denies any weight gain or increase in SOB. I reviewed with Dr Aundra Dubin. Dr Aundra Dubin recommended pt keep his feet and legs elevated, avoid NA and ETOH, and call in symptoms do not improve in the next day or so. I discussed Dr Claris Gladden recommendations with pt. Pt verbalized understanding.

## 2011-02-04 ENCOUNTER — Ambulatory Visit: Payer: Medicare Other | Admitting: Cardiology

## 2011-02-27 ENCOUNTER — Other Ambulatory Visit: Payer: Self-pay

## 2011-02-27 MED ORDER — LISINOPRIL 5 MG PO TABS: 5.0000 mg | ORAL_TABLET | Freq: Every day | ORAL | Status: DC

## 2011-03-04 ENCOUNTER — Other Ambulatory Visit: Payer: Self-pay | Admitting: *Deleted

## 2011-03-04 MED ORDER — LISINOPRIL 5 MG PO TABS: 5.0000 mg | ORAL_TABLET | Freq: Every day | ORAL | Status: DC

## 2011-04-08 ENCOUNTER — Ambulatory Visit (INDEPENDENT_AMBULATORY_CARE_PROVIDER_SITE_OTHER): Payer: Medicare Other | Admitting: Cardiology

## 2011-04-08 ENCOUNTER — Encounter: Payer: Self-pay | Admitting: Cardiology

## 2011-04-08 VITALS — BP 118/78 | HR 71 | Ht 73.5 in | Wt 215.8 lb

## 2011-04-08 DIAGNOSIS — R609 Edema, unspecified: Secondary | ICD-10-CM

## 2011-04-08 DIAGNOSIS — I509 Heart failure, unspecified: Secondary | ICD-10-CM

## 2011-04-08 DIAGNOSIS — I2510 Atherosclerotic heart disease of native coronary artery without angina pectoris: Secondary | ICD-10-CM

## 2011-04-08 DIAGNOSIS — E785 Hyperlipidemia, unspecified: Secondary | ICD-10-CM

## 2011-04-08 DIAGNOSIS — R0602 Shortness of breath: Secondary | ICD-10-CM

## 2011-04-08 DIAGNOSIS — F17200 Nicotine dependence, unspecified, uncomplicated: Secondary | ICD-10-CM

## 2011-04-08 DIAGNOSIS — I5032 Chronic diastolic (congestive) heart failure: Secondary | ICD-10-CM

## 2011-04-08 MED ORDER — FUROSEMIDE 40 MG PO TABS: 40.0000 mg | ORAL_TABLET | Freq: Every day | ORAL | Status: DC

## 2011-04-08 MED ORDER — ROSUVASTATIN CALCIUM 40 MG PO TABS: 40.0000 mg | ORAL_TABLET | Freq: Every day | ORAL | Status: DC

## 2011-04-08 MED ORDER — POTASSIUM CHLORIDE CRYS ER 20 MEQ PO TBCR: 20.0000 meq | EXTENDED_RELEASE_TABLET | Freq: Every day | ORAL | Status: DC

## 2011-04-08 NOTE — Assessment & Plan Note (Signed)
Similarly to last fall, Arthur Francis has developed weight gain and lower extremity edema. He was extensively worked up last fall with similar weight gain and edema.  Echo showed no significant abnormality.  BNP was not elevated.  He did not have proteinuria.  No LFT abnormalities.  Normal TSH.  Abdominal ultrasound did not show ascites (abdomen had appeared distended).  At that time, Lasix was increased to 40 mg daily and he cut back on beer drinking, and the edema resolved.  Suspect weight gain is a combination of dietary indiscretion + inactivity in addition to diastolic CHF.  - Increase Lasix to 40 mg daily and KCl to 20 mEq daily.  - BNP today.  - BMET in 2 wks.

## 2011-04-08 NOTE — Patient Instructions (Signed)
Increase lasix(furosemide) to 73m daily.  Increase KCL(potassium) to 20 mEq daily.  Decrease aspirin to 851mdaily.  Your physician recommends that you have  lab work today--BNP   Your physician recommends that you return for lab work in: 2 weeks BMET/BNP   Your physician recommends that you return for a FASTING lipid profile: April 2013   Your physician recommends that you schedule a follow-up appointment in: 4 months with Dr McAundra Dubin
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
## 2011-04-08 NOTE — Assessment & Plan Note (Addendum)
No angina.  Continue ASA, Toprol XL, ACEI, and statin.  Can decrease ASA to 81 mg daily.

## 2011-04-08 NOTE — Assessment & Plan Note (Signed)
Check lipids 4/13, goal LDL < 70.

## 2011-04-08 NOTE — Assessment & Plan Note (Signed)
I counseled him to quit smoking.  He could not tolerate Wellbutrin.  Encouraged him to try nicotine patches.

## 2011-06-08 ENCOUNTER — Other Ambulatory Visit (INDEPENDENT_AMBULATORY_CARE_PROVIDER_SITE_OTHER): Payer: Medicare Other

## 2011-06-08 DIAGNOSIS — I509 Heart failure, unspecified: Secondary | ICD-10-CM

## 2011-06-08 DIAGNOSIS — I2510 Atherosclerotic heart disease of native coronary artery without angina pectoris: Secondary | ICD-10-CM

## 2011-06-08 DIAGNOSIS — I5032 Chronic diastolic (congestive) heart failure: Secondary | ICD-10-CM

## 2011-06-08 DIAGNOSIS — R0602 Shortness of breath: Secondary | ICD-10-CM

## 2011-06-08 LAB — BRAIN NATRIURETIC PEPTIDE: Pro B Natriuretic peptide (BNP): 14 pg/mL (ref 0.0–100.0)

## 2011-06-08 LAB — BASIC METABOLIC PANEL
BUN: 10 mg/dL (ref 6–23)
CO2: 27 mEq/L (ref 19–32)
Chloride: 104 mEq/L (ref 96–112)
Creatinine, Ser: 1 mg/dL (ref 0.4–1.5)

## 2011-06-08 LAB — LIPID PANEL
LDL Cholesterol: 60 mg/dL (ref 0–99)
Total CHOL/HDL Ratio: 2
VLDL: 10.2 mg/dL (ref 0.0–40.0)

## 2011-07-03 ENCOUNTER — Other Ambulatory Visit: Payer: Self-pay | Admitting: Cardiology

## 2011-09-02 ENCOUNTER — Other Ambulatory Visit: Payer: Self-pay | Admitting: Cardiology

## 2011-09-04 ENCOUNTER — Encounter: Payer: Self-pay | Admitting: Cardiology

## 2011-09-04 ENCOUNTER — Ambulatory Visit (INDEPENDENT_AMBULATORY_CARE_PROVIDER_SITE_OTHER): Payer: Medicare Other | Admitting: Cardiology

## 2011-09-04 VITALS — BP 124/90 | HR 90 | Ht 73.5 in | Wt 210.0 lb

## 2011-09-04 DIAGNOSIS — E785 Hyperlipidemia, unspecified: Secondary | ICD-10-CM

## 2011-09-04 DIAGNOSIS — F17200 Nicotine dependence, unspecified, uncomplicated: Secondary | ICD-10-CM

## 2011-09-04 DIAGNOSIS — R0602 Shortness of breath: Secondary | ICD-10-CM

## 2011-09-04 DIAGNOSIS — R609 Edema, unspecified: Secondary | ICD-10-CM

## 2011-09-04 DIAGNOSIS — I2510 Atherosclerotic heart disease of native coronary artery without angina pectoris: Secondary | ICD-10-CM

## 2011-09-04 LAB — BASIC METABOLIC PANEL
CO2: 26 mEq/L (ref 19–32)
Chloride: 105 mEq/L (ref 96–112)
Creat: 1.09 mg/dL (ref 0.50–1.35)
Glucose, Bld: 88 mg/dL (ref 70–99)

## 2011-09-04 NOTE — Patient Instructions (Addendum)
Your physician recommends that you have lab work today--BMET/BNP  Your physician wants you to follow-up in: 6 months with Dr Aundra Dubin. (January 2014). You will receive a reminder letter in the mail two months in advance. If you don't receive a letter, please call our office to schedule the follow-up appointment.

## 2011-09-06 NOTE — Assessment & Plan Note (Signed)
Patient has a degree of diastolic CHF with periodic peripheral edema that is highly correlated with high-salt meals.  I talked to him at length about cutting out soy sauce and [place] take-out.  I am not going to increase his Lasix.  I will get a BMET/BNP today.
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
## 2011-09-06 NOTE — Assessment & Plan Note (Signed)
No angina.  Continue ASA, Toprol XL, ACEI, and statin.

## 2011-09-06 NOTE — Assessment & Plan Note (Signed)
LDL at goal (< 70) in 4/13.

## 2011-09-06 NOTE — Assessment & Plan Note (Signed)
I again strongly encouraged him to stop smoking.  He is considering using nicotine patches.

## 2011-09-21 ENCOUNTER — Telehealth: Payer: Self-pay | Admitting: Cardiology

## 2011-09-21 NOTE — Telephone Encounter (Signed)
I spoke with the patient. He states that for the past 2 days he has had edema up to his knees and his legs are red and blotchy. He states he has not had any diet changes. He last ate [place] food about 5 days ago and he did not add any sauce to this. He does not check his weights at home. His breathing is ok. He takes lasix 40 mg once daily. He has not missed any lasix doses and his urine out put is about the same. He states his clothes are fitting a little tighter. I advised the patient I would forward the message to Dr. Aundra Dubin to see if he wants to change his lasix dose. He is agreeable with this. I will call back with Dr. Claris Gladden recommendations.

## 2011-09-21 NOTE — Telephone Encounter (Signed)
He can take Lasix 40 mg bid x 3 days then back to 40 mg daily.

## 2011-09-21 NOTE — Telephone Encounter (Signed)
Pt's legs swelling about 2 days ago, pls advise

## 2011-09-22 NOTE — Telephone Encounter (Signed)
The patient is aware of Dr. Claris Gladden recommendations.

## 2011-10-27 ENCOUNTER — Other Ambulatory Visit: Payer: Self-pay | Admitting: Cardiology

## 2011-11-02 ENCOUNTER — Telehealth: Payer: Self-pay | Admitting: Cardiology

## 2011-11-02 NOTE — Telephone Encounter (Signed)
Pt has a question regarding medications he needs clarification of the meds he is suppose to be on

## 2011-11-02 NOTE — Telephone Encounter (Signed)
Pt found bottle of HCTZ in his bag and wanted to know if he should be on it. Pt is on lasix and there is no HCTZ on his list. Told pt it was an old med and not to take HCTZ pt agrees to plan.

## 2012-01-25 ENCOUNTER — Other Ambulatory Visit: Payer: Self-pay | Admitting: Cardiology

## 2012-02-19 ENCOUNTER — Other Ambulatory Visit: Payer: Self-pay | Admitting: Cardiology

## 2012-03-02 ENCOUNTER — Emergency Department (HOSPITAL_COMMUNITY): Payer: PRIVATE HEALTH INSURANCE

## 2012-03-02 ENCOUNTER — Encounter (HOSPITAL_COMMUNITY): Payer: Self-pay | Admitting: Vascular Surgery

## 2012-03-02 ENCOUNTER — Emergency Department (HOSPITAL_COMMUNITY)
Admission: EM | Admit: 2012-03-02 | Discharge: 2012-03-02 | Disposition: A | Discharge status: Home / Self Care | Payer: PRIVATE HEALTH INSURANCE | Attending: Emergency Medicine | Admitting: Emergency Medicine

## 2012-03-02 DIAGNOSIS — S161XXA Strain of muscle, fascia and tendon at neck level, initial encounter: Secondary | ICD-10-CM

## 2012-03-02 DIAGNOSIS — M545 Low back pain, unspecified: Secondary | ICD-10-CM | POA: Insufficient documentation

## 2012-03-02 DIAGNOSIS — R10819 Abdominal tenderness, unspecified site: Secondary | ICD-10-CM | POA: Insufficient documentation

## 2012-03-02 DIAGNOSIS — Z8679 Personal history of other diseases of the circulatory system: Secondary | ICD-10-CM | POA: Insufficient documentation

## 2012-03-02 DIAGNOSIS — I252 Old myocardial infarction: Secondary | ICD-10-CM | POA: Insufficient documentation

## 2012-03-02 DIAGNOSIS — Z79899 Other long term (current) drug therapy: Secondary | ICD-10-CM | POA: Insufficient documentation

## 2012-03-02 DIAGNOSIS — Y9389 Activity, other specified: Secondary | ICD-10-CM | POA: Insufficient documentation

## 2012-03-02 DIAGNOSIS — F329 Major depressive disorder, single episode, unspecified: Secondary | ICD-10-CM | POA: Insufficient documentation

## 2012-03-02 DIAGNOSIS — I2510 Atherosclerotic heart disease of native coronary artery without angina pectoris: Secondary | ICD-10-CM | POA: Insufficient documentation

## 2012-03-02 DIAGNOSIS — Y92410 Unspecified street and highway as the place of occurrence of the external cause: Secondary | ICD-10-CM | POA: Insufficient documentation

## 2012-03-02 DIAGNOSIS — Z8781 Personal history of (healed) traumatic fracture: Secondary | ICD-10-CM | POA: Insufficient documentation

## 2012-03-02 DIAGNOSIS — S139XXA Sprain of joints and ligaments of unspecified parts of neck, initial encounter: Secondary | ICD-10-CM | POA: Insufficient documentation

## 2012-03-02 DIAGNOSIS — F17200 Nicotine dependence, unspecified, uncomplicated: Secondary | ICD-10-CM | POA: Insufficient documentation

## 2012-03-02 DIAGNOSIS — F3289 Other specified depressive episodes: Secondary | ICD-10-CM | POA: Insufficient documentation

## 2012-03-02 DIAGNOSIS — E785 Hyperlipidemia, unspecified: Secondary | ICD-10-CM | POA: Insufficient documentation

## 2012-03-02 DIAGNOSIS — R079 Chest pain, unspecified: Secondary | ICD-10-CM | POA: Insufficient documentation

## 2012-03-02 DIAGNOSIS — R55 Syncope and collapse: Secondary | ICD-10-CM | POA: Insufficient documentation

## 2012-03-02 HISTORY — DX: Old myocardial infarction: I25.2

## 2012-03-02 LAB — COMPREHENSIVE METABOLIC PANEL
ALT: 27 U/L (ref 0–53)
Calcium: 10.1 mg/dL (ref 8.4–10.5)
GFR calc Af Amer: >90 mL/min (ref >90)
Glucose, Bld: 91 mg/dL (ref 70–99)
Sodium: 142 mEq/L (ref 135–145)
Total Protein: 8.3 g/dL (ref 6.0–8.3)

## 2012-03-02 LAB — CBC WITH DIFFERENTIAL/PLATELET
Basophils Relative: 0 % (ref 0–1)
Eosinophils Absolute: 0.1 10*3/uL (ref 0.0–0.7)
Hemoglobin: 17.8 g/dL — ABNORMAL HIGH (ref 13.0–17.0)
MCH: 31.7 pg (ref 26.0–34.0)
MCHC: 34.4 g/dL (ref 30.0–36.0)
Monocytes Absolute: 0.6 10*3/uL (ref 0.1–1.0)
Neutrophils Relative %: 68 % (ref 43–77)
Platelets: ADEQUATE 10*3/uL (ref 150–400)
RDW: 13.7 % (ref 11.5–15.5)

## 2012-03-02 LAB — RAPID URINE DRUG SCREEN, HOSP PERFORMED
Barbiturates: NOT DETECTED
Benzodiazepines: NOT DETECTED
Cocaine: NOT DETECTED

## 2012-03-02 LAB — ETHANOL: Alcohol, Ethyl (B): 146 mg/dL — ABNORMAL HIGH (ref 0–11)

## 2012-03-02 MED ORDER — SODIUM CHLORIDE 0.9 % IV SOLN
INTRAVENOUS | Status: DC
  Administered 2012-03-02: 19:00:00 via INTRAVENOUS

## 2012-03-02 MED ORDER — IOHEXOL 300 MG/ML  SOLN: 100.0000 mL | Freq: Once | INTRAMUSCULAR | Status: DC | PRN

## 2012-03-02 MED ORDER — IOHEXOL 300 MG/ML  SOLN
100.0000 mL | Freq: Once | INTRAMUSCULAR | Status: AC | PRN
Start: 2012-03-02 — End: 2012-03-02
  Administered 2012-03-02: 100 mL via INTRAVENOUS

## 2012-03-02 MED ORDER — HYDROMORPHONE HCL PF 1 MG/ML IJ SOLN
1.0000 mg | Freq: Once | INTRAMUSCULAR | Status: AC
  Administered 2012-03-02: 1 mg via INTRAVENOUS
  Filled 2012-03-02: qty 1

## 2012-03-02 MED ORDER — SODIUM CHLORIDE 0.9 % IV BOLUS (SEPSIS)
500.0000 mL | Freq: Once | INTRAVENOUS | Status: AC
  Administered 2012-03-02: 500 mL via INTRAVENOUS

## 2012-03-02 NOTE — ED Notes (Signed)
Myself and [place], RN assisted [place] with log rolling pt to remove LSB underneath; c-collar still intact
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
## 2012-05-11 ENCOUNTER — Other Ambulatory Visit: Payer: Self-pay | Admitting: Cardiology

## 2012-05-17 ENCOUNTER — Other Ambulatory Visit: Payer: Self-pay | Admitting: *Deleted

## 2012-05-17 MED ORDER — ROSUVASTATIN CALCIUM 40 MG PO TABS: 40.0000 mg | ORAL_TABLET | Freq: Every day | ORAL | Status: DC

## 2012-06-03 ENCOUNTER — Telehealth: Payer: Self-pay | Admitting: Cardiology

## 2012-06-03 NOTE — Telephone Encounter (Signed)
Patient stated he had put a bottle of HCTZ in with other medications and not sure if he should be taking. Reviewed medication list and HCTZ not on the list, lasix is. Advised to use only the Lasix (furosemide). Did have patient look at the date on the bottle that HCTZ dispensed 10/2010, advised that it was old and to dispose of the medication. Patient verbalized understanding.

## 2012-06-03 NOTE — Telephone Encounter (Signed)
New problem   Pt need to know if he is suppose to be taking hydrochlorothiazide 57m. Please call

## 2012-08-17 ENCOUNTER — Other Ambulatory Visit: Payer: Self-pay | Admitting: Cardiology

## 2012-08-19 ENCOUNTER — Other Ambulatory Visit: Payer: Self-pay | Admitting: *Deleted

## 2012-08-19 MED ORDER — METOPROLOL SUCCINATE ER 25 MG PO TB24: ORAL_TABLET | ORAL | Status: DC

## 2012-10-12 ENCOUNTER — Other Ambulatory Visit: Payer: Self-pay | Admitting: *Deleted

## 2012-10-12 MED ORDER — LISINOPRIL 5 MG PO TABS: 5.0000 mg | ORAL_TABLET | Freq: Every day | ORAL | Status: DC

## 2012-10-12 MED ORDER — METOPROLOL SUCCINATE ER 25 MG PO TB24: ORAL_TABLET | ORAL | Status: DC

## 2012-10-12 MED ORDER — POTASSIUM CHLORIDE CRYS ER 20 MEQ PO TBCR: EXTENDED_RELEASE_TABLET | ORAL | Status: DC

## 2012-10-12 MED ORDER — FUROSEMIDE 40 MG PO TABS: ORAL_TABLET | ORAL | Status: DC

## 2012-10-13 ENCOUNTER — Other Ambulatory Visit: Payer: Self-pay | Admitting: *Deleted

## 2012-10-26 ENCOUNTER — Telehealth: Payer: Self-pay | Admitting: Cardiology

## 2012-10-26 NOTE — Telephone Encounter (Signed)
New prob  Pt states he is supposed to have some teeth pulled by Dr Hoyt Koch. He said that he needs a letter of clearance from Dr Aundra Dubin.

## 2012-10-26 NOTE — Telephone Encounter (Signed)
Pt states he needs 4 teeth extracted, oral surgeon wants OK from Dr Aundra Dubin for general anesthesia. Pt's last OV with  Dr Aundra Dubin July 2013. Pt states he has had some SOB and chest pain a couple of months ago relieved with 1 NTG. Pt has appt with Dr Aundra Dubin in October. Per Dr Orpah Greek should be checked in office prior to dental procedure. Pt requests appt 11/22/12--I have scheduled him with Richardson Dopp, PA,c.

## 2012-11-09 ENCOUNTER — Other Ambulatory Visit: Payer: Self-pay | Admitting: Cardiology

## 2012-11-22 ENCOUNTER — Ambulatory Visit (INDEPENDENT_AMBULATORY_CARE_PROVIDER_SITE_OTHER): Payer: Medicare Other | Admitting: Physician Assistant

## 2012-11-22 ENCOUNTER — Encounter: Payer: Self-pay | Admitting: Physician Assistant

## 2012-11-22 VITALS — BP 110/76 | HR 56 | Ht 73.5 in | Wt 200.6 lb

## 2012-11-22 DIAGNOSIS — Z01810 Encounter for preprocedural cardiovascular examination: Secondary | ICD-10-CM

## 2012-11-22 DIAGNOSIS — R079 Chest pain, unspecified: Secondary | ICD-10-CM

## 2012-11-22 DIAGNOSIS — I5032 Chronic diastolic (congestive) heart failure: Secondary | ICD-10-CM

## 2012-11-22 DIAGNOSIS — F17200 Nicotine dependence, unspecified, uncomplicated: Secondary | ICD-10-CM

## 2012-11-22 DIAGNOSIS — E785 Hyperlipidemia, unspecified: Secondary | ICD-10-CM

## 2012-11-22 DIAGNOSIS — I2510 Atherosclerotic heart disease of native coronary artery without angina pectoris: Secondary | ICD-10-CM

## 2012-11-22 DIAGNOSIS — I10 Essential (primary) hypertension: Secondary | ICD-10-CM

## 2012-11-22 LAB — BASIC METABOLIC PANEL
CO2: 21 mEq/L (ref 19–32)
Calcium: 9.4 mg/dL (ref 8.4–10.5)
Creatinine, Ser: 1.1 mg/dL (ref 0.4–1.5)
Glucose, Bld: 89 mg/dL (ref 70–99)
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
## 2012-11-22 NOTE — Patient Instructions (Addendum)
Your physician recommends that you continue on your current medications as directed. Please refer to the Current Medication list given to you today.  LABS TODAY BMET  Your physician has requested that you have en exercise stress myoview. For further information please visit [email or web address]. Please follow instruction sheet, as given.  Your physician recommends that you return for a FASTING lipid profile AND LFT TO BE DRAWN SAME DAY AS ETT [place]  Your physician recommends that you schedule a follow-up appointment in: [place]

## 2012-12-02 ENCOUNTER — Encounter: Payer: Self-pay | Admitting: Cardiology

## 2012-12-05 ENCOUNTER — Telehealth: Payer: Self-pay | Admitting: Cardiology

## 2012-12-05 NOTE — Telephone Encounter (Signed)
LM for [place], pt has myoview scheduled 12/07/12. Dr Aundra Dubin will need to review the results and  provide clearance for surgery based on the results.

## 2012-12-05 NOTE — Telephone Encounter (Signed)
New problem    Status of cardiac clearance - for upcoming dental procedure.

## 2012-12-06 ENCOUNTER — Ambulatory Visit: Payer: Medicare Other | Admitting: Cardiology

## 2012-12-07 ENCOUNTER — Other Ambulatory Visit (INDEPENDENT_AMBULATORY_CARE_PROVIDER_SITE_OTHER): Payer: Medicare Other

## 2012-12-07 ENCOUNTER — Ambulatory Visit (HOSPITAL_COMMUNITY): Payer: Medicare Other | Attending: Physician Assistant | Admitting: Radiology

## 2012-12-07 VITALS — BP 91/75 | Ht 73.5 in | Wt 196.0 lb

## 2012-12-07 DIAGNOSIS — I10 Essential (primary) hypertension: Secondary | ICD-10-CM

## 2012-12-07 DIAGNOSIS — F17200 Nicotine dependence, unspecified, uncomplicated: Secondary | ICD-10-CM | POA: Insufficient documentation

## 2012-12-07 DIAGNOSIS — E785 Hyperlipidemia, unspecified: Secondary | ICD-10-CM

## 2012-12-07 DIAGNOSIS — R5381 Other malaise: Secondary | ICD-10-CM | POA: Insufficient documentation

## 2012-12-07 DIAGNOSIS — I2510 Atherosclerotic heart disease of native coronary artery without angina pectoris: Secondary | ICD-10-CM

## 2012-12-07 DIAGNOSIS — Z01810 Encounter for preprocedural cardiovascular examination: Secondary | ICD-10-CM

## 2012-12-07 DIAGNOSIS — Z9861 Coronary angioplasty status: Secondary | ICD-10-CM | POA: Insufficient documentation

## 2012-12-07 DIAGNOSIS — R079 Chest pain, unspecified: Secondary | ICD-10-CM | POA: Insufficient documentation

## 2012-12-07 DIAGNOSIS — R9439 Abnormal result of other cardiovascular function study: Secondary | ICD-10-CM | POA: Insufficient documentation

## 2012-12-07 LAB — HEPATIC FUNCTION PANEL
ALT: 24 U/L (ref 0–53)
AST: 24 U/L (ref 0–37)
Albumin: 4 g/dL (ref 3.5–5.2)
Alkaline Phosphatase: 41 U/L (ref 39–117)
Bilirubin, Direct: 0.1 mg/dL (ref 0.0–0.3)
Total Bilirubin: 0.6 mg/dL (ref 0.3–1.2)

## 2012-12-07 LAB — LIPID PANEL
LDL Cholesterol: 30 mg/dL (ref 0–99)
Total CHOL/HDL Ratio: 2
VLDL: 13 mg/dL (ref 0.0–40.0)

## 2012-12-07 MED ORDER — TECHNETIUM TC 99M SESTAMIBI GENERIC - CARDIOLITE
11.0000 | Freq: Once | INTRAVENOUS | Status: AC | PRN
  Administered 2012-12-07: 11 via INTRAVENOUS

## 2012-12-07 MED ORDER — TECHNETIUM TC 99M SESTAMIBI GENERIC - CARDIOLITE
33.0000 | Freq: Once | INTRAVENOUS | Status: AC | PRN
  Administered 2012-12-07: 33 via INTRAVENOUS

## 2012-12-07 NOTE — Progress Notes (Signed)
[facility] [address] [place], [place] [address] [phone]    Cardiology Nuclear Med Study  [place] is a 57 y.o. male     MRN : [id number]     DOB: 1955/09/05  Procedure Date: 12/07/2012  Nuclear Med Background Indication for Stress Test:  Evaluation for Ischemia, Surgical Clearance- Pending Dental Surgery- Dr. Hoyt Koch and [facility]CA Pat[place] History:  2012 Echo EF 60-65%, 2011 MPS Normal EF 64%,2010 Heart Catheterization EF 60%, 2006 Stent LAD Cardiac Risk Factors: Hypertension, Lipids and Smoker  Symptoms:  Chest Pain   Nuclear Pre-Procedure Caffeine/Decaff Intake:  None NPO After: 7:00pm   Lungs:  clear O2 Sat: 99% on room air. IV 0.9% NS with Angio Cath:  22g  IV Site: R Hand  IV Started by:  Crissie Figures, RN  Chest Size (in):  48 Cup Size: n/a  Height: 6' 1.5" (1.867 m)  Weight:  196 lb (88.905 kg)  BMI:  Body mass index is 25.51 kg/(m^2). Tech Comments:  Toprol held x 24 hours, Labs drawn. The patient was slow moving like he had too much of his Rx.  Slow to respond.    Nuclear Med Study 1 or 2 day study: 1 day  Stress Test Type:  Stress  Reading MD: [name], MD  Order Authorizing Provider:  [name], MD  Resting Radionuclide: Technetium 68mSestamibi  Resting Radionuclide Dose: 11.0 mCi   Stress Radionuclide:  Technetium 926mestamibi  Stress Radionuclide Dose: 33.0 mCi           Stress Protocol Rest HR: 52 Stress HR: 148  Rest BP: 91/75 Stress BP: 159/88  Exercise Time (min): 4:30 METS: 6.40   Predicted Max HR: 163 bpm % Max HR: 90.8 bpm Rate Pressure Product: 23532   Dose of Adenosine (mg):  n/a Dose of Lexiscan: n/a mg  Dose of Atropine (mg): n/a Dose of Dobutamine: n/a mcg/kg/min (at max HR)  Stress Test Technologist: SaPerrin MalteseEMT-P  Nuclear Technologist:  ToAnnye RuskCNMT     Rest Procedure:  Myocardial perfusion imaging was performed at rest 45 minutes following the intravenous administration of  Technetium 9971mstamibi. Rest ECG: NSR - Normal EKG  Stress Procedure:  The patient exercised on the treadmill utilizing the Bruce Protocol for 4:30 minutes. The patient stopped due to fatigue and denied any chest pain.  Technetium 28m37mtamibi was injected at peak exercise and myocardial perfusion imaging was performed after a brief delay. Stress ECG: No significant change from baseline ECG  QPS Raw Data Images:  Normal; no motion artifact; normal heart/lung ratio. Stress Images:  Medium-sized, mild basal to mid inferior perfusion defect. Rest Images:  Medium-sized, mild basal to mid inferior defect, somewhat less prominent than stress.  Subtraction (SDS):  Partially reversible medium-sized, mild basal to mid inferior perfusion defect. Transient Ischemic Dilatation (Normal <1.22):  0.95 Lung/Heart Ratio (Normal <0.45):  0.30  Quantitative Gated Spect Images QGS EDV:  102 ml QGS ESV:  33 ml  Impression Exercise Capacity:  Poor exercise capacity. BP Response:  Normal blood pressure response. Clinical Symptoms:  Fatigue.  ECG Impression:  No significant ST segment change suggestive of ischemia. Comparison with Prior Nuclear Study: Inferior perfusion defect was not seen on prior study.   Overall Impression:  Low risk stress nuclear study . Primarily fixed medium-sized, mild basal to mid inferior perfusion defect.  Possible diaphragmatic attenuation but cannot rule out prior infarction with mild peri-infarction ischemia. .  LMarland Kitchen  Ejection Fraction: 67%.  LV Wall Motion:  NL LV Function; NL Wall Motion  [name] 12/07/2012

## 2012-12-08 ENCOUNTER — Encounter: Payer: Self-pay | Admitting: Physician Assistant

## 2012-12-08 NOTE — Telephone Encounter (Signed)
Follow up    Pt retured your call please call him back.

## 2012-12-13 NOTE — Telephone Encounter (Signed)
Please see comments attached to myoview done 12/07/12.

## 2012-12-22 ENCOUNTER — Other Ambulatory Visit: Payer: Self-pay | Admitting: Cardiology

## 2013-01-08 IMAGING — US US ABDOMEN LIMITED
1 series · 14 of 18 positions shown · non-contrast
Comparison: None.

CLINICAL DATA: Evaluate for ascites.  Abdominal swelling, leg
swelling.

LIMITED ABDOMEN ULTRASOUND FOR ASCITES
TECHNIQUE: Limited ultrasound survey for ascites was performed in
all four abdominal quadrants.

[Series 1: us abdomen limited · 0.32mm/px · 14 of 18 slices shown]
[im 1/18]
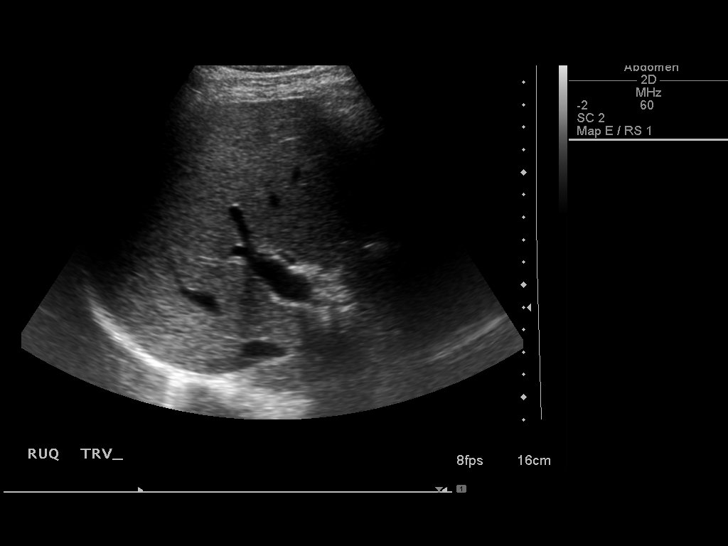
[im 2/18]
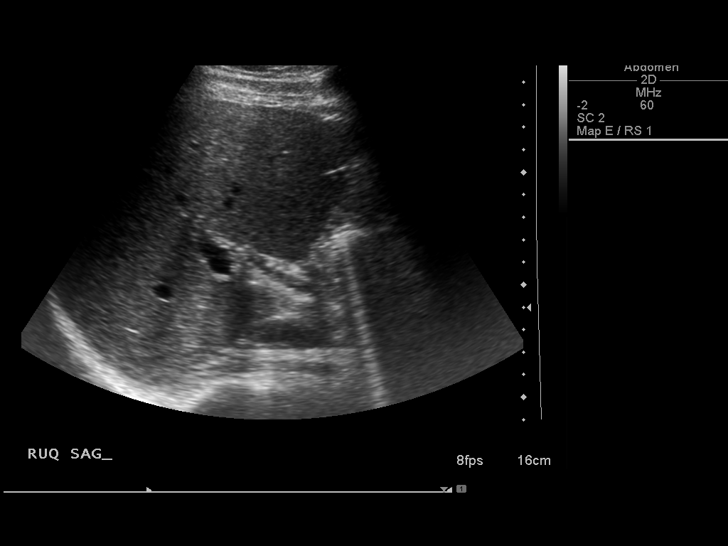
[im 4/18]
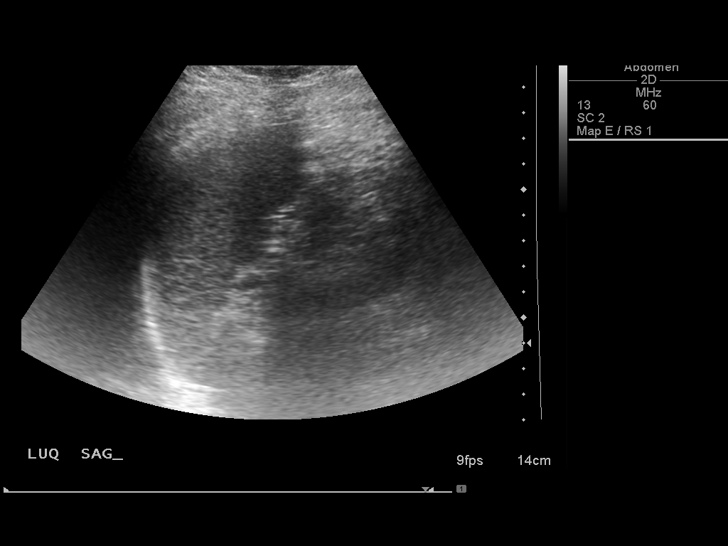
[im 5/18]
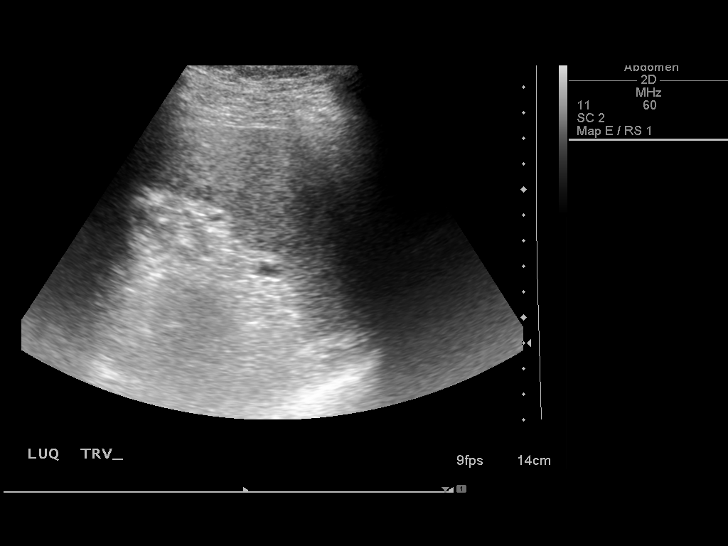
[im 6/18]
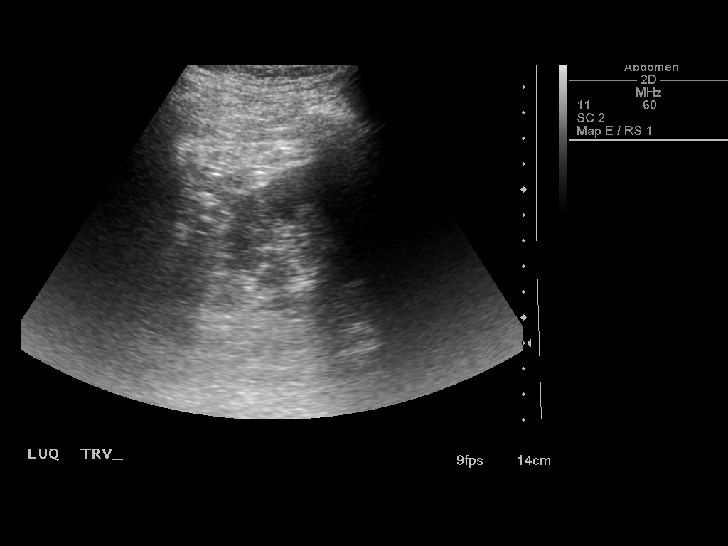
[im 8/18]
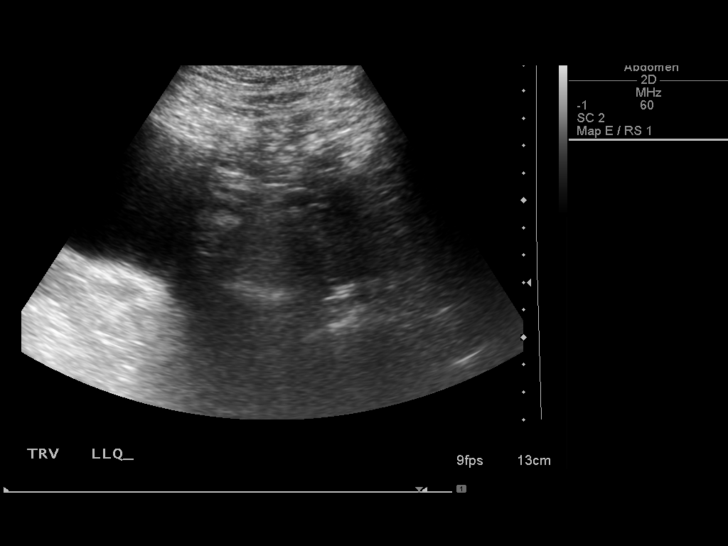
[im 9/18]
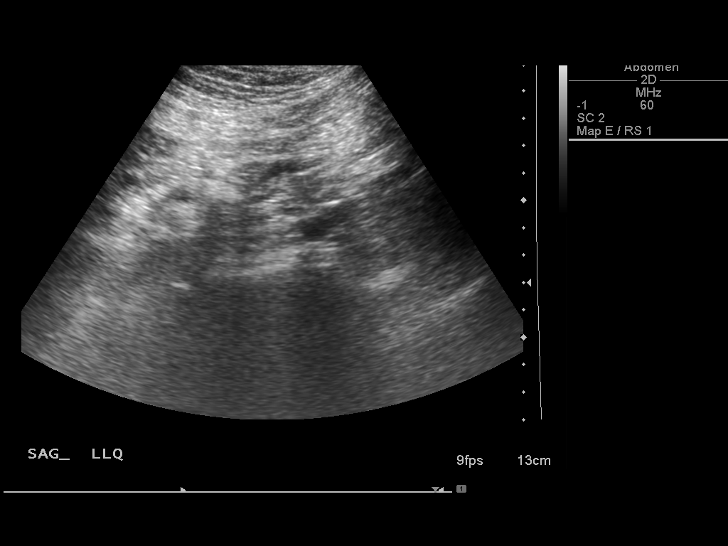
[im 10/18]
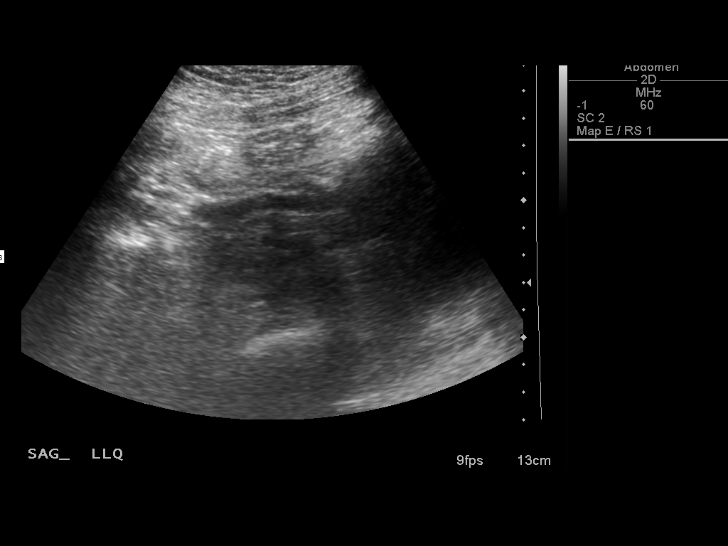
[im 11/18]
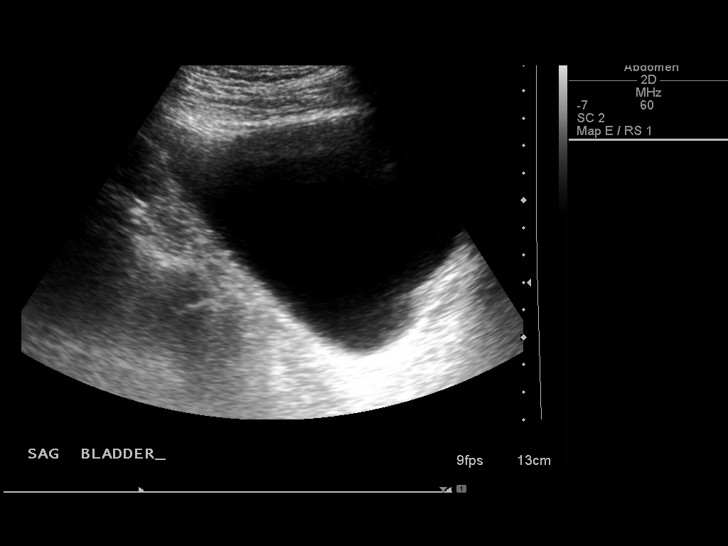
[im 13/18]
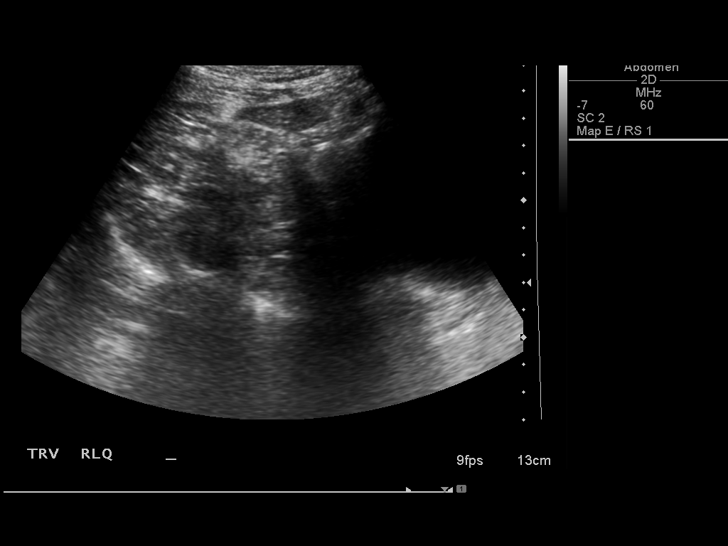
[im 14/18]
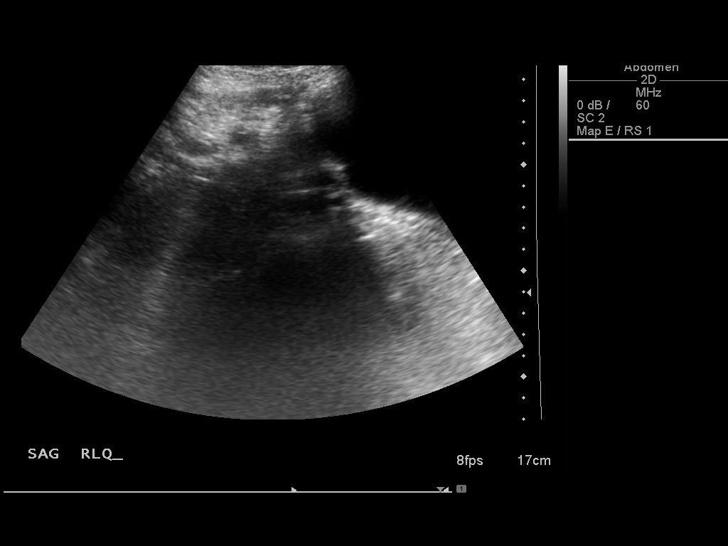
[im 15/18]
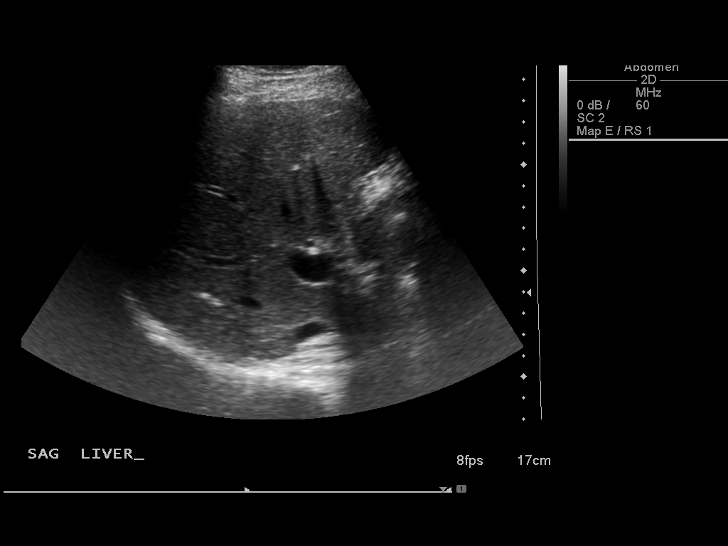
[im 17/18]
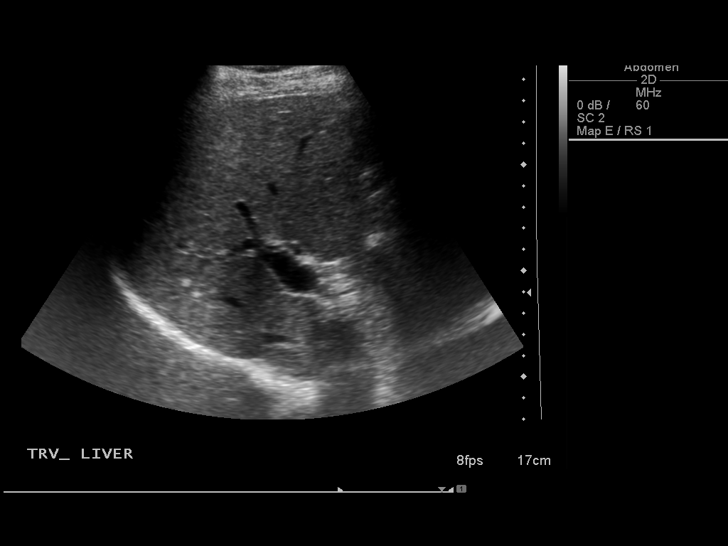
[im 18/18]
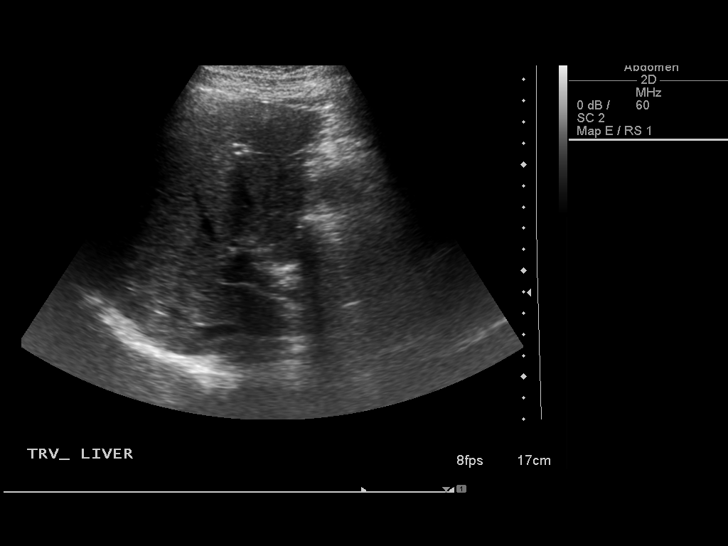

[14 of 18 positions shown; findings below may reference images not displayed]

FINDINGS: Four-quadrant assessment for fluid demonstrates no
visible ascites.
IMPRESSION: No evidence of ascites.

## 2013-01-20 ENCOUNTER — Other Ambulatory Visit: Payer: Self-pay | Admitting: Cardiology

## 2013-02-17 ENCOUNTER — Other Ambulatory Visit: Payer: Self-pay

## 2013-02-17 MED ORDER — METOPROLOL SUCCINATE ER 25 MG PO TB24: ORAL_TABLET | ORAL | Status: DC

## 2013-03-07 ENCOUNTER — Other Ambulatory Visit: Payer: Self-pay | Admitting: Cardiology

## 2013-05-17 ENCOUNTER — Other Ambulatory Visit: Payer: Self-pay | Admitting: Cardiology

## 2013-06-16 ENCOUNTER — Other Ambulatory Visit: Payer: Self-pay | Admitting: Cardiology

## 2013-07-15 ENCOUNTER — Other Ambulatory Visit: Payer: Self-pay | Admitting: Cardiology

## 2013-08-14 ENCOUNTER — Other Ambulatory Visit: Payer: Self-pay | Admitting: Cardiology

## 2013-09-13 ENCOUNTER — Other Ambulatory Visit: Payer: Self-pay | Admitting: Cardiology

## 2013-09-29 ENCOUNTER — Other Ambulatory Visit: Payer: Self-pay | Admitting: Cardiology

## 2013-10-06 ENCOUNTER — Telehealth: Payer: Self-pay

## 2013-10-06 NOTE — Telephone Encounter (Signed)
You can refill but he needs to make appt also.

## 2013-10-09 NOTE — Telephone Encounter (Signed)
He needs to make an appt before medication is refilled. Once he has an appt scheduled we can refill medication until that appt. Donnita [place], Dr Claris Gladden scheduler can help you with an appt with Dr Aundra Dubin. Thanks

## 2013-10-12 ENCOUNTER — Encounter: Payer: Self-pay | Admitting: Cardiology

## 2013-10-12 NOTE — Telephone Encounter (Signed)
I sent pt a letter b/c his number on file is invalid.

## 2013-11-10 ENCOUNTER — Ambulatory Visit (INDEPENDENT_AMBULATORY_CARE_PROVIDER_SITE_OTHER): Payer: Medicare Other | Admitting: Cardiology

## 2013-11-10 ENCOUNTER — Encounter: Payer: Self-pay | Admitting: Cardiology

## 2013-11-10 VITALS — BP 122/74 | HR 66 | Ht 73.5 in | Wt 205.0 lb

## 2013-11-10 DIAGNOSIS — E785 Hyperlipidemia, unspecified: Secondary | ICD-10-CM

## 2013-11-10 DIAGNOSIS — F17200 Nicotine dependence, unspecified, uncomplicated: Secondary | ICD-10-CM

## 2013-11-10 DIAGNOSIS — I2510 Atherosclerotic heart disease of native coronary artery without angina pectoris: Secondary | ICD-10-CM

## 2013-11-10 LAB — BASIC METABOLIC PANEL
BUN: 13 mg/dL (ref 6–23)
CHLORIDE: 101 meq/L (ref 96–112)
CO2: 30 mEq/L (ref 19–32)
CREATININE: 1.3 mg/dL (ref 0.4–1.5)
Calcium: 9.2 mg/dL (ref 8.4–10.5)
GFR: 60.8 mL/min (ref >60.00)
GLUCOSE: 79 mg/dL (ref 70–99)
POTASSIUM: 3.8 meq/L (ref 3.5–5.1)
Sodium: 138 mEq/L (ref 135–145)

## 2013-11-10 LAB — LIPID PANEL
CHOLESTEROL: 116 mg/dL (ref 0–200)
HDL: 31 mg/dL — ABNORMAL LOW (ref >39.00)
LDL Cholesterol: 68 mg/dL (ref 0–99)
NonHDL: 85
TRIGLYCERIDES: 86 mg/dL (ref 0.0–149.0)
Total CHOL/HDL Ratio: 4
VLDL: 17.2 mg/dL (ref 0.0–40.0)

## 2013-11-10 NOTE — Patient Instructions (Signed)
Your physician recommends that you have lab work today--Lipid profile/BMET.  Your physician wants you to follow-up in: 1 year with Dr Aundra Dubin. (September 2016). You will receive a reminder letter in the mail two months in advance. If you don't receive a letter, please call our office to schedule the follow-up appointment.
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
## 2014-01-06 ENCOUNTER — Other Ambulatory Visit: Payer: Self-pay | Admitting: Cardiology

## 2014-01-23 ENCOUNTER — Telehealth: Payer: Self-pay | Admitting: Cardiology

## 2014-01-23 NOTE — Telephone Encounter (Signed)
New message           Office faxed over a medical clearance form to be filled out by Dr. Aundra Dubin / If you received it please fax back to [phone] Donnella Sham / It needs to state that he is medically cleared for surgery

## 2014-01-23 NOTE — Telephone Encounter (Signed)
Spoke with  Aldona Bar at Dr Bary Leriche Jensen's office regarding pt's surgical clearance. Aldona Bar states that she had faxed the form on 01/15/14 for Dr. Aundra Dubin to sign. Aldona Bar is aware that we have not received that form, and for her to fax it again . This office's fax number given. Aldona Bar will fax surgical clearance back today.

## 2014-01-26 ENCOUNTER — Telehealth: Payer: Self-pay | Admitting: Cardiology

## 2014-01-26 NOTE — Telephone Encounter (Signed)
Received request from Nurse fax box, documents faxed for surgical clearance. To: [facility]  Fax number: [phone] Attention: 12.4.15/km

## 2014-01-26 NOTE — Telephone Encounter (Signed)
Surgical clearance form from Dr Hoyt Koch completed by Dr Aundra Dubin and returned to HIM.

## 2014-02-05 ENCOUNTER — Other Ambulatory Visit: Payer: Self-pay | Admitting: Cardiology

## 2014-02-12 ENCOUNTER — Other Ambulatory Visit: Payer: Self-pay | Admitting: Cardiology

## 2014-05-02 ENCOUNTER — Other Ambulatory Visit: Payer: Self-pay

## 2014-05-02 MED ORDER — LISINOPRIL 5 MG PO TABS: 5.0000 mg | ORAL_TABLET | Freq: Every day | ORAL | Status: DC

## 2014-05-09 ENCOUNTER — Other Ambulatory Visit: Payer: Self-pay

## 2014-05-09 MED ORDER — ROSUVASTATIN CALCIUM 40 MG PO TABS: ORAL_TABLET | ORAL | Status: DC

## 2014-06-27 ENCOUNTER — Other Ambulatory Visit (HOSPITAL_COMMUNITY): Payer: Self-pay | Admitting: Nurse Practitioner

## 2014-06-27 DIAGNOSIS — B182 Chronic viral hepatitis C: Secondary | ICD-10-CM

## 2014-07-10 ENCOUNTER — Telehealth (HOSPITAL_COMMUNITY): Payer: Self-pay

## 2014-07-11 ENCOUNTER — Ambulatory Visit (HOSPITAL_COMMUNITY)
Admission: RE | Admit: 2014-07-11 | Discharge: 2014-07-11 | Disposition: A | Discharge status: Home / Self Care | Payer: Medicare Other | Source: Ambulatory Visit | Attending: Nurse Practitioner | Admitting: Nurse Practitioner

## 2014-07-11 DIAGNOSIS — K824 Cholesterolosis of gallbladder: Secondary | ICD-10-CM | POA: Insufficient documentation

## 2014-07-11 DIAGNOSIS — I10 Essential (primary) hypertension: Secondary | ICD-10-CM | POA: Insufficient documentation

## 2014-07-11 DIAGNOSIS — B182 Chronic viral hepatitis C: Secondary | ICD-10-CM | POA: Insufficient documentation

## 2014-08-13 ENCOUNTER — Other Ambulatory Visit: Payer: Self-pay | Admitting: Cardiology

## 2014-08-25 ENCOUNTER — Other Ambulatory Visit: Payer: Self-pay | Admitting: Cardiology

## 2014-10-24 ENCOUNTER — Other Ambulatory Visit: Payer: Self-pay | Admitting: Cardiology

## 2014-11-15 ENCOUNTER — Other Ambulatory Visit: Payer: Self-pay | Admitting: Cardiology

## 2014-12-07 ENCOUNTER — Other Ambulatory Visit: Payer: Self-pay | Admitting: Nurse Practitioner

## 2014-12-07 DIAGNOSIS — K7469 Other cirrhosis of liver: Secondary | ICD-10-CM

## 2014-12-14 ENCOUNTER — Other Ambulatory Visit: Payer: Self-pay | Admitting: Cardiology

## 2014-12-15 ENCOUNTER — Other Ambulatory Visit: Payer: Self-pay | Admitting: Cardiology

## 2014-12-17 ENCOUNTER — Encounter: Payer: Self-pay | Admitting: *Deleted

## 2014-12-17 ENCOUNTER — Other Ambulatory Visit: Payer: Self-pay | Admitting: *Deleted

## 2014-12-17 ENCOUNTER — Other Ambulatory Visit: Payer: Self-pay | Admitting: Cardiology

## 2014-12-17 MED ORDER — METOPROLOL SUCCINATE ER 25 MG PO TB24: 25.0000 mg | ORAL_TABLET | Freq: Every day | ORAL | Status: DC

## 2014-12-17 MED ORDER — POTASSIUM CHLORIDE CRYS ER 20 MEQ PO TBCR: EXTENDED_RELEASE_TABLET | ORAL | Status: DC

## 2015-01-07 ENCOUNTER — Ambulatory Visit
Admission: RE | Admit: 2015-01-07 | Discharge: 2015-01-07 | Disposition: A | Discharge status: Home / Self Care | Payer: Medicare Other | Source: Ambulatory Visit | Attending: Nurse Practitioner | Admitting: Nurse Practitioner

## 2015-01-07 DIAGNOSIS — K7469 Other cirrhosis of liver: Secondary | ICD-10-CM

## 2015-01-12 ENCOUNTER — Other Ambulatory Visit: Payer: Self-pay | Admitting: Cardiology

## 2015-01-15 ENCOUNTER — Other Ambulatory Visit: Payer: Self-pay | Admitting: Cardiology

## 2015-01-16 ENCOUNTER — Telehealth: Payer: Self-pay | Admitting: Cardiology

## 2015-01-16 MED ORDER — POTASSIUM CHLORIDE CRYS ER 20 MEQ PO TBCR: EXTENDED_RELEASE_TABLET | ORAL | Status: DC

## 2015-01-16 NOTE — Telephone Encounter (Signed)
New message       *STAT* If patient is at the pharmacy, call can be transferred to refill team.   1. Which medications need to be refilled? (please list name of each medication and dose if known) potassium 24mq 2. Which pharmacy/location (including street and city if local pharmacy) is medication to be sent to? [facility]  3. Do they need a 30 day or 90 day supply?  [facility] received all of the other presc refills but did not get this one.  Please resubmit refill

## 2015-01-16 NOTE — Telephone Encounter (Signed)
Called pt to inform him that we needed him to schedule an appointment for future refills. Pt stated that he would schedule an appointment, but he still has not schedule an appointment. I did send a refill on potasium 20 meq, with notice to schedule an appointment before anymore refills.

## 2015-01-18 ENCOUNTER — Other Ambulatory Visit: Payer: Self-pay | Admitting: Cardiology

## 2015-01-28 ENCOUNTER — Other Ambulatory Visit: Payer: Self-pay | Admitting: Cardiology

## 2015-01-28 NOTE — Telephone Encounter (Signed)
Larey Dresser, MD at 11/12/2013 9:29 AM  Toprol XL 25 daily Assessment/Plan: 1. CAD: No ischemic symptoms. No ischemia on Cardiolite in 10/14. Continue ASA 81, Crestor, ACEI, Toprol XL

## 2015-02-11 ENCOUNTER — Ambulatory Visit (INDEPENDENT_AMBULATORY_CARE_PROVIDER_SITE_OTHER): Payer: Medicare Other | Admitting: Physician Assistant

## 2015-02-11 ENCOUNTER — Encounter: Payer: Self-pay | Admitting: Physician Assistant

## 2015-02-11 ENCOUNTER — Other Ambulatory Visit: Payer: Self-pay | Admitting: Cardiology

## 2015-02-11 VITALS — BP 114/70 | HR 70 | Ht 73.0 in | Wt 223.6 lb

## 2015-02-11 DIAGNOSIS — I25118 Atherosclerotic heart disease of native coronary artery with other forms of angina pectoris: Secondary | ICD-10-CM

## 2015-02-11 DIAGNOSIS — F17200 Nicotine dependence, unspecified, uncomplicated: Secondary | ICD-10-CM

## 2015-02-11 DIAGNOSIS — I10 Essential (primary) hypertension: Secondary | ICD-10-CM | POA: Diagnosis not present

## 2015-02-11 DIAGNOSIS — R079 Chest pain, unspecified: Secondary | ICD-10-CM

## 2015-02-11 DIAGNOSIS — Z720 Tobacco use: Secondary | ICD-10-CM

## 2015-02-11 NOTE — Assessment & Plan Note (Addendum)
He has had 3 episodes of chest pain over the past year relieved with nitroglycerin. All episodes were at rest. He can't exercise much because of chronic back and knee pain. He has multiple cardiac risk factors including ongoing cigarette use. Recommend Lexi scan Myoview.

## 2015-02-11 NOTE — Assessment & Plan Note (Signed)
I had a long discussion with the patient concerning smoking cessation. He knows he needs to quit. He has done it 3 times in the past.

## 2015-02-11 NOTE — Patient Instructions (Signed)
Medication Instructions:   CONTINUE SAME MEDICATIONS    If you need a refill on your cardiac medications before your next appointment, please call your pharmacy.  Labwork: NONE ORDER TODAY    Testing/Procedures: Your physician has requested that you have a lexiscan myoview. For further information please visit [email or web address]. Please follow instruction sheet, as given.    Follow-Up: WITH DR [facility] IN 3 MONTHS OR NEXT AVAILABLE    Any Other Special Instructions Will Be Listed Below (If Applicable).
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
## 2015-02-11 NOTE — Assessment & Plan Note (Signed)
Patient is on Crestor. Blood work done by primary care

## 2015-02-11 NOTE — Assessment & Plan Note (Addendum)
Patient had MI in 2006 treated with EMS to the LAD. Cath in 2010 20% in-stent restenosis, no significant ischemia on ETT Cardiolite in 2014. Now having had 3 episodes of chest pain in the past year is relieved with nitroglycerin. Recommend Lexi scan Myoview. Follow-up with Dr. Algernon Huxley in 3 months or sooner if needed.

## 2015-02-11 NOTE — Assessment & Plan Note (Signed)
Blood pressure controlled. Refill medications.

## 2015-02-12 ENCOUNTER — Encounter: Payer: Self-pay | Admitting: *Deleted

## 2015-02-12 ENCOUNTER — Other Ambulatory Visit: Payer: Self-pay | Admitting: *Deleted

## 2015-02-12 MED ORDER — LISINOPRIL 5 MG PO TABS: 5.0000 mg | ORAL_TABLET | Freq: Every day | ORAL | Status: DC

## 2015-02-12 MED ORDER — CRESTOR 40 MG PO TABS: ORAL_TABLET | ORAL | Status: DC

## 2015-02-12 MED ORDER — METOPROLOL SUCCINATE ER 25 MG PO TB24: 25.0000 mg | ORAL_TABLET | Freq: Every day | ORAL | Status: DC

## 2015-02-12 MED ORDER — POTASSIUM CHLORIDE CRYS ER 20 MEQ PO TBCR: EXTENDED_RELEASE_TABLET | ORAL | Status: DC

## 2015-02-12 MED ORDER — FUROSEMIDE 40 MG PO TABS: ORAL_TABLET | ORAL | Status: DC

## 2015-02-12 MED ORDER — NITROGLYCERIN 0.4 MG SL SUBL: 0.4000 mg | SUBLINGUAL_TABLET | SUBLINGUAL | Status: DC | PRN

## 2015-03-11 ENCOUNTER — Telehealth (HOSPITAL_COMMUNITY): Payer: Self-pay | Admitting: *Deleted

## 2015-03-11 NOTE — Telephone Encounter (Signed)
Patient given detailed instructions per Myocardial Perfusion Study Information Sheet for the test on 03/13/15 at 1000. Patient notified to arrive 15 minutes early and that it is imperative to arrive on time for appointment to keep from having the test rescheduled.  If you need to cancel or reschedule your appointment, please call the office within 24 hours of your appointment. Failure to do so may result in a cancellation of your appointment, and a $50 no show fee. Patient verbalized understanding.Arthur Francis, Arthur Francis

## 2015-03-13 ENCOUNTER — Ambulatory Visit (HOSPITAL_COMMUNITY): Payer: Medicare Other | Attending: Cardiology

## 2015-03-13 DIAGNOSIS — R079 Chest pain, unspecified: Secondary | ICD-10-CM | POA: Diagnosis present

## 2015-03-13 DIAGNOSIS — I10 Essential (primary) hypertension: Secondary | ICD-10-CM | POA: Diagnosis not present

## 2015-03-13 LAB — MYOCARDIAL PERFUSION IMAGING
CHL CUP NUCLEAR SDS: 1
CHL CUP RESTING HR STRESS: 57 {beats}/min
CSEPPHR: 91 {beats}/min
LVDIAVOL: 115 mL
LVSYSVOL: 40 mL
RATE: 0.35
SRS: 4
SSS: 5
TID: 0.99

## 2015-03-13 MED ORDER — TECHNETIUM TC 99M SESTAMIBI GENERIC - CARDIOLITE
32.6000 | Freq: Once | INTRAVENOUS | Status: AC | PRN
  Administered 2015-03-13: 33 via INTRAVENOUS

## 2015-03-13 MED ORDER — REGADENOSON 0.4 MG/5ML IV SOLN
0.4000 mg | Freq: Once | INTRAVENOUS | Status: AC
  Administered 2015-03-13: 0.4 mg via INTRAVENOUS

## 2015-03-13 MED ORDER — TECHNETIUM TC 99M SESTAMIBI GENERIC - CARDIOLITE
10.9000 | Freq: Once | INTRAVENOUS | Status: AC | PRN
  Administered 2015-03-13: 10.9 via INTRAVENOUS

## 2015-04-05 ENCOUNTER — Other Ambulatory Visit: Payer: Self-pay | Admitting: Cardiology

## 2015-04-05 MED ORDER — CRESTOR 40 MG PO TABS: ORAL_TABLET | ORAL | Status: DC

## 2015-04-05 MED ORDER — LISINOPRIL 5 MG PO TABS: 5.0000 mg | ORAL_TABLET | Freq: Every day | ORAL | Status: DC

## 2015-05-01 ENCOUNTER — Encounter: Payer: Self-pay | Admitting: Cardiology
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
## 2015-05-09 ENCOUNTER — Ambulatory Visit (INDEPENDENT_AMBULATORY_CARE_PROVIDER_SITE_OTHER): Payer: Medicare Other | Admitting: Physician Assistant

## 2015-05-09 ENCOUNTER — Encounter: Payer: Self-pay | Admitting: Physician Assistant

## 2015-05-09 VITALS — BP 118/78 | HR 60 | Ht 73.0 in | Wt 209.0 lb

## 2015-05-09 DIAGNOSIS — I2510 Atherosclerotic heart disease of native coronary artery without angina pectoris: Secondary | ICD-10-CM

## 2015-05-09 DIAGNOSIS — Z720 Tobacco use: Secondary | ICD-10-CM

## 2015-05-09 DIAGNOSIS — I10 Essential (primary) hypertension: Secondary | ICD-10-CM | POA: Diagnosis not present

## 2015-05-09 DIAGNOSIS — F17200 Nicotine dependence, unspecified, uncomplicated: Secondary | ICD-10-CM

## 2015-05-09 DIAGNOSIS — R079 Chest pain, unspecified: Secondary | ICD-10-CM

## 2015-05-09 DIAGNOSIS — E785 Hyperlipidemia, unspecified: Secondary | ICD-10-CM | POA: Diagnosis not present

## 2015-05-09 MED ORDER — POTASSIUM CHLORIDE CRYS ER 20 MEQ PO TBCR: EXTENDED_RELEASE_TABLET | ORAL | Status: DC

## 2015-05-09 NOTE — Patient Instructions (Addendum)
Medication Instructions:  ONLY TAKE THE POTASSIUM ON DAYS WHEN YOU TAKE THE LASIX  Labwork: Schedule Fasting - CMET, Lipids  Testing/Procedures: Your physician has requested that you have an echocardiogram. Echocardiography is a painless test that uses sound waves to create images of your heart. It provides your doctor with information about the size and shape of your heart and how well your heart's chambers and valves are working. This procedure takes approximately one hour. There are no restrictions for this procedure.   Follow-Up: Your physician wants you to follow-up in: 4 MONTHS WITH DR. Aundra Dubin  You will receive a reminder letter in the mail two months in advance. If you don't receive a letter, please call our office to schedule the follow-up appointment.  Any Other Special Instructions Will Be Listed Below (If Applicable).  If you need a refill on your cardiac medications before your next appointment, please call your pharmacy.

## 2015-05-10 ENCOUNTER — Ambulatory Visit: Payer: Medicare Other | Admitting: Cardiology

## 2015-05-22 ENCOUNTER — Other Ambulatory Visit: Payer: Self-pay | Admitting: Nurse Practitioner

## 2015-05-22 DIAGNOSIS — B182 Chronic viral hepatitis C: Secondary | ICD-10-CM

## 2015-05-22 DIAGNOSIS — K7469 Other cirrhosis of liver: Secondary | ICD-10-CM

## 2015-05-29 ENCOUNTER — Ambulatory Visit (HOSPITAL_COMMUNITY): Payer: Medicare Other | Attending: Cardiovascular Disease

## 2015-05-29 ENCOUNTER — Other Ambulatory Visit (INDEPENDENT_AMBULATORY_CARE_PROVIDER_SITE_OTHER): Payer: Medicare Other | Admitting: *Deleted

## 2015-05-29 ENCOUNTER — Other Ambulatory Visit: Payer: Self-pay

## 2015-05-29 ENCOUNTER — Encounter: Payer: Self-pay | Admitting: Physician Assistant

## 2015-05-29 DIAGNOSIS — R079 Chest pain, unspecified: Secondary | ICD-10-CM | POA: Diagnosis present

## 2015-05-29 DIAGNOSIS — I2510 Atherosclerotic heart disease of native coronary artery without angina pectoris: Secondary | ICD-10-CM | POA: Diagnosis not present

## 2015-05-29 DIAGNOSIS — I10 Essential (primary) hypertension: Secondary | ICD-10-CM | POA: Diagnosis not present

## 2015-05-29 DIAGNOSIS — Z720 Tobacco use: Secondary | ICD-10-CM | POA: Insufficient documentation

## 2015-05-29 DIAGNOSIS — E785 Hyperlipidemia, unspecified: Secondary | ICD-10-CM | POA: Diagnosis not present

## 2015-05-29 LAB — COMPREHENSIVE METABOLIC PANEL
ALK PHOS: 54 U/L (ref 40–115)
ALT: 14 U/L (ref 9–46)
AST: 17 U/L (ref 10–35)
Albumin: 4.3 g/dL (ref 3.6–5.1)
BILIRUBIN TOTAL: 0.8 mg/dL (ref 0.2–1.2)
BUN: 8 mg/dL (ref 7–25)
CO2: 22 mmol/L (ref 20–31)
Calcium: 9.2 mg/dL (ref 8.6–10.3)
Chloride: 109 mmol/L (ref 98–110)
Creat: 1.19 mg/dL (ref 0.70–1.33)
Glucose, Bld: 98 mg/dL (ref 65–99)
POTASSIUM: 4.3 mmol/L (ref 3.5–5.3)
Sodium: 141 mmol/L (ref 135–146)
Total Protein: 6.6 g/dL (ref 6.1–8.1)

## 2015-05-29 LAB — LIPID PANEL
Cholesterol: 98 mg/dL — ABNORMAL LOW (ref 125–200)
HDL: 34 mg/dL — AB (ref >=40)
LDL CALC: 43 mg/dL (ref <130)
TRIGLYCERIDES: 107 mg/dL (ref <150)
Total CHOL/HDL Ratio: 2.9 Ratio (ref <=5.0)
VLDL: 21 mg/dL (ref <30)

## 2015-05-29 NOTE — Addendum Note (Signed)
Addended by: Eulis Foster on: 05/29/2015 10:09 AM   Modules accepted: Orders

## 2015-05-30 ENCOUNTER — Ambulatory Visit
Admission: RE | Admit: 2015-05-30 | Discharge: 2015-05-30 | Disposition: A | Discharge status: Home / Self Care | Payer: Medicare Other | Source: Ambulatory Visit | Attending: Nurse Practitioner | Admitting: Nurse Practitioner

## 2015-05-30 ENCOUNTER — Telehealth: Payer: Self-pay | Admitting: *Deleted

## 2015-05-30 DIAGNOSIS — B182 Chronic viral hepatitis C: Secondary | ICD-10-CM

## 2015-05-30 DIAGNOSIS — K7469 Other cirrhosis of liver: Secondary | ICD-10-CM

## 2015-05-30 NOTE — Telephone Encounter (Signed)
Pt has been notified of lab results by phone with verbal understanding. 

## 2015-07-06 ENCOUNTER — Other Ambulatory Visit: Payer: Self-pay | Admitting: Physician Assistant

## 2015-07-18 ENCOUNTER — Encounter: Payer: Self-pay | Admitting: Internal Medicine

## 2015-08-03 ENCOUNTER — Other Ambulatory Visit: Payer: Self-pay | Admitting: Physician Assistant

## 2015-09-02 ENCOUNTER — Other Ambulatory Visit: Payer: Self-pay | Admitting: Cardiology

## 2015-09-04 ENCOUNTER — Ambulatory Visit (AMBULATORY_SURGERY_CENTER): Payer: Self-pay | Admitting: *Deleted

## 2015-09-04 VITALS — Ht 73.0 in | Wt 218.4 lb

## 2015-09-04 DIAGNOSIS — Z1211 Encounter for screening for malignant neoplasm of colon: Secondary | ICD-10-CM

## 2015-09-04 MED ORDER — NA SULFATE-K SULFATE-MG SULF 17.5-3.13-1.6 GM/177ML PO SOLN: 1.0000 | Freq: Once | ORAL | Status: DC

## 2015-09-04 NOTE — Progress Notes (Signed)
Denies allergies to eggs or soy products. Denies complications with sedation or anesthesia. Denies O2 use. Denies use of diet or weight loss medications.  Emmi instructions not given for colonoscopy. Pt does not have access to the Internet.

## 2015-09-18 ENCOUNTER — Encounter: Payer: Self-pay | Admitting: Internal Medicine

## 2015-09-18 ENCOUNTER — Ambulatory Visit (AMBULATORY_SURGERY_CENTER): Payer: Medicare Other | Admitting: Internal Medicine

## 2015-09-18 VITALS — BP 108/70 | HR 54 | Temp 98.6°F | Resp 13 | Ht 73.0 in | Wt 218.0 lb

## 2015-09-18 DIAGNOSIS — Z1211 Encounter for screening for malignant neoplasm of colon: Secondary | ICD-10-CM

## 2015-09-18 NOTE — Op Note (Signed)
[facility] Patient Name: Arthur Francis Procedure Date: 09/18/2015 9:21 AM MRN: [id number] Endoscopist: Docia Chuck. Henrene Pastor , MD Age: 60 Referring MD:  Date of Birth: 1955/12/26 Gender: Male Account #: [id number] Procedure:                Colonoscopy Indications:              Screening for colorectal malignant neoplasm Medicines:                Monitored Anesthesia Care Procedure:                Pre-Anesthesia Assessment:                           - Prior to the procedure, a History and Physical                            was performed, and patient medications and                            allergies were reviewed. The patient's tolerance of                            previous anesthesia was also reviewed. The risks                            and benefits of the procedure and the sedation                            options and risks were discussed with the patient.                            All questions were answered, and informed consent                            was obtained. Prior Anticoagulants: The patient has                            taken no previous anticoagulant or antiplatelet                            agents. ASA Grade Assessment: III - A patient with                            severe systemic disease. After reviewing the risks                            and benefits, the patient was deemed in                            satisfactory condition to undergo the procedure.                           After obtaining informed consent, the colonoscope  was passed under direct vision. Throughout the                            procedure, the patient's blood pressure, pulse, and                            oxygen saturations were monitored continuously. The                            Model CF-HQ190L [id number]) scope was introduced                            through the anus and advanced to the the cecum,                            identified by  appendiceal orifice and ileocecal                            valve. The ileocecal valve, appendiceal orifice,                            and rectum were photographed. The quality of the                            bowel preparation was excellent. The colonoscopy                            was performed without difficulty. The patient                            tolerated the procedure well. The bowel preparation                            used was SUPREP. Scope In: 9:29:53 AM Scope Out: 9:40:28 AM Scope Withdrawal Time: 0 hours 8 minutes 56 seconds  Total Procedure Duration: 0 hours 10 minutes 35 seconds  Findings:                 The entire examined colon appeared normal on direct                            and retroflexion views. Complications:            No immediate complications. Estimated blood loss:                            None. Estimated Blood Loss:     Estimated blood loss: none. Impression:               - The entire examined colon is normal on direct and                            retroflexion views.                           - No specimens collected. Recommendation:           -  Repeat colonoscopy in 10 years for screening                            purposes.                           - Patient has a contact number available for                            emergencies. The signs and symptoms of potential                            delayed complications were discussed with the                            patient. Return to normal activities tomorrow.                            Written discharge instructions were provided to the                            patient.                           - Resume previous diet.                           - Continue present medications. Docia Chuck. Henrene Pastor, MD 09/18/2015 9:44:31 AM This report has been signed electronically.

## 2015-09-18 NOTE — Progress Notes (Signed)
A/ox3 pleased with MAC, report to Robbin RN 

## 2015-09-18 NOTE — Patient Instructions (Signed)
Normal colon exam today. Repeat colonoscopy in 10 years. Resume current medications. Call us with any questions or concerns. Thank you!   YOU HAD AN ENDOSCOPIC PROCEDURE TODAY AT [facility]:   Refer to the procedure report that was given to you for any specific questions about what was found during the examination.  If the procedure report does not answer your questions, please call your gastroenterologist to clarify.  If you requested that your care partner not be given the details of your procedure findings, then the procedure report has been included in a sealed envelope for you to review at your convenience later.  YOU SHOULD EXPECT: Some feelings of bloating in the abdomen. Passage of more gas than usual.  Walking can help get rid of the air that was put into your GI tract during the procedure and reduce the bloating. If you had a lower endoscopy (such as a colonoscopy or flexible sigmoidoscopy) you may notice spotting of blood in your stool or on the toilet paper. If you underwent a bowel prep for your procedure, you may not have a normal bowel movement for a few days.  Please Note:  You might notice some irritation and congestion in your nose or some drainage.  This is from the oxygen used during your procedure.  There is no need for concern and it should clear up in a day or so.  SYMPTOMS TO REPORT IMMEDIATELY:   Following lower endoscopy (colonoscopy or flexible sigmoidoscopy):  Excessive amounts of blood in the stool  Significant tenderness or worsening of abdominal pains  Swelling of the abdomen that is new, acute  Fever of 100F or higher   For urgent or emergent issues, a gastroenterologist can be reached at any hour by calling [phone].   DIET: Your first meal following the procedure should be a small meal and then it is ok to progress to your normal diet. Heavy or fried foods are harder to digest and may make you feel nauseous or bloated.  Likewise, meals  heavy in dairy and vegetables can increase bloating.  Drink plenty of fluids but you should avoid alcoholic beverages for 24 hours.  ACTIVITY:  You should plan to take it easy for the rest of today and you should NOT DRIVE or use heavy machinery until tomorrow (because of the sedation medicines used during the test).    FOLLOW UP: Our staff will call the number listed on your records the next business day following your procedure to check on you and address any questions or concerns that you may have regarding the information given to you following your procedure. If we do not reach you, we will leave a message.  However, if you are feeling well and you are not experiencing any problems, there is no need to return our call.  We will assume that you have returned to your regular daily activities without incident.  If any biopsies were taken you will be contacted by phone or by letter within the next 1-3 weeks.  Please call us at [phone] if you have not heard about the biopsies in 3 weeks.    SIGNATURES/CONFIDENTIALITY: You and/or your care partner have signed paperwork which will be entered into your electronic medical record.  These signatures attest to the fact that that the information above on your After Visit Summary has been reviewed and is understood.  Full responsibility of the confidentiality of this discharge information lies with you and/or your care-partner.

## 2015-09-19 ENCOUNTER — Telehealth: Payer: Self-pay

## 2015-09-19 NOTE — Telephone Encounter (Signed)
  Follow up Call-  Call back number 09/18/2015  Post procedure Call Back phone  # [phone]  Permission to leave phone message Yes  Some recent data might be hidden     Patient questions:  Do you have a fever, pain , or abdominal swelling? No. Pain Score  0 *  Have you tolerated food without any problems? Yes.    Have you been able to return to your normal activities? Yes.    Do you have any questions about your discharge instructions: Diet   No. Medications  No. Follow up visit  No.  Do you have questions or concerns about your Care? No.  Actions: * If pain score is 4 or above: No action needed, pain <4.

## 2015-11-20 ENCOUNTER — Other Ambulatory Visit: Payer: Self-pay | Admitting: Nurse Practitioner

## 2015-11-20 DIAGNOSIS — K7469 Other cirrhosis of liver: Secondary | ICD-10-CM

## 2015-11-29 ENCOUNTER — Ambulatory Visit
Admission: RE | Admit: 2015-11-29 | Discharge: 2015-11-29 | Disposition: A | Discharge status: Home / Self Care | Payer: Medicare Other | Source: Ambulatory Visit | Attending: Nurse Practitioner | Admitting: Nurse Practitioner

## 2015-11-29 DIAGNOSIS — K7469 Other cirrhosis of liver: Secondary | ICD-10-CM

## 2015-11-30 ENCOUNTER — Other Ambulatory Visit: Payer: Self-pay | Admitting: Cardiology

## 2015-12-02 ENCOUNTER — Other Ambulatory Visit: Payer: Medicare Other

## 2016-01-02 ENCOUNTER — Other Ambulatory Visit: Payer: Self-pay | Admitting: Nurse Practitioner

## 2016-01-02 DIAGNOSIS — J918 Pleural effusion in other conditions classified elsewhere: Secondary | ICD-10-CM

## 2016-01-02 DIAGNOSIS — K769 Liver disease, unspecified: Principal | ICD-10-CM

## 2016-01-10 ENCOUNTER — Other Ambulatory Visit: Payer: Self-pay | Admitting: Cardiology

## 2016-01-29 ENCOUNTER — Other Ambulatory Visit: Payer: Self-pay | Admitting: Cardiology

## 2016-03-04 ENCOUNTER — Inpatient Hospital Stay: Admission: RE | Admit: 2016-03-04 | Payer: Medicare Other | Source: Ambulatory Visit

## 2016-03-10 ENCOUNTER — Ambulatory Visit
Admission: RE | Admit: 2016-03-10 | Discharge: 2016-03-10 | Disposition: A | Discharge status: Home / Self Care | Payer: Medicare Other | Source: Ambulatory Visit | Attending: Nurse Practitioner | Admitting: Nurse Practitioner

## 2016-03-10 DIAGNOSIS — J918 Pleural effusion in other conditions classified elsewhere: Secondary | ICD-10-CM

## 2016-03-10 DIAGNOSIS — K769 Liver disease, unspecified: Principal | ICD-10-CM

## 2016-03-10 MED ORDER — GADOXETATE DISODIUM 0.25 MMOL/ML IV SOLN
10.0000 mL | Freq: Once | INTRAVENOUS | Status: AC | PRN
  Administered 2016-03-10: 10 mL via INTRAVENOUS

## 2016-03-27 ENCOUNTER — Other Ambulatory Visit: Payer: Self-pay | Admitting: Psychiatry

## 2016-03-27 DIAGNOSIS — M545 Low back pain: Principal | ICD-10-CM

## 2016-03-27 DIAGNOSIS — G8929 Other chronic pain: Secondary | ICD-10-CM

## 2016-04-10 ENCOUNTER — Ambulatory Visit
Admission: RE | Admit: 2016-04-10 | Discharge: 2016-04-10 | Disposition: A | Discharge status: Home / Self Care | Payer: Medicare Other | Source: Ambulatory Visit | Attending: Psychiatry | Admitting: Psychiatry

## 2016-04-10 DIAGNOSIS — M545 Low back pain: Principal | ICD-10-CM

## 2016-04-10 DIAGNOSIS — G8929 Other chronic pain: Secondary | ICD-10-CM

## 2016-07-14 ENCOUNTER — Other Ambulatory Visit: Payer: Self-pay | Admitting: Physician Assistant

## 2016-07-14 ENCOUNTER — Other Ambulatory Visit: Payer: Self-pay | Admitting: Cardiology

## 2016-07-22 ENCOUNTER — Other Ambulatory Visit: Payer: Self-pay | Admitting: *Deleted

## 2016-07-22 NOTE — Telephone Encounter (Signed)
Last seen by Kathlen Mody

## 2016-07-22 NOTE — Telephone Encounter (Signed)
Followed/last seen by Kathlen Mody

## 2016-07-24 ENCOUNTER — Other Ambulatory Visit: Payer: Self-pay | Admitting: Cardiology

## 2016-07-24 NOTE — Telephone Encounter (Signed)
Approved    Disp Refills Start End  metoprolol succinate (TOPROL-XL) 25 MG 24 hr tablet 15 tablet 0 07/22/2016   Sig - Route:  Take 1 tablet (25 mg total) by mouth daily. *Patient is overdue for an appt and needs to call and schedule for further refills 2nd attempt* - Oral  Class:  Normal  DAW:  No  Authorizing Provider:  Larey Dresser, MD  Ordering User:  Juventino Slovak, CMA  lisinopril (PRINIVIL,ZESTRIL) 5 MG tablet 15 tablet 0 07/22/2016   Sig - Route:  Take 1 tablet (5 mg total) by mouth daily. *Patient is overdue for an appt and needs to call and schedule for further refills 2nd attempt* - Oral  Class:  Normal  DAW:  No  Authorizing Provider:  Larey Dresser, MD  Ordering User:  Juventino Slovak, CMA  rosuvastatin (CRESTOR) 40 MG tablet 15 tablet 0 07/22/2016   Sig - Route:  Take 1 tablet (40 mg total) by mouth daily. *Patient is overdue for an appt and needs to call and schedule for further refills 2nd attempt* - Oral  Class:  Normal  DAW:  No  Authorizing Provider:  Larey Dresser, MD  Ordering User:  Juventino Slovak, CMA  Refused    Disp Refills Start End  furosemide (LASIX) 40 MG tablet 30 tablet  07/22/2016   Sig - Route:  Take 1 tablet (40 mg total) by mouth daily as needed. - Oral  Class:  Normal  DAW:  No  Reason for Refusal:  Request already responded to by other means (e.g. phone or fax)  Refused By:  Juventino Slovak, [place] [address] [place] [address]

## 2016-08-05 ENCOUNTER — Other Ambulatory Visit: Payer: Self-pay | Admitting: Cardiology

## 2016-08-07 NOTE — Telephone Encounter (Signed)
Followed by Kathlen Mody

## 2016-08-12 ENCOUNTER — Ambulatory Visit (INDEPENDENT_AMBULATORY_CARE_PROVIDER_SITE_OTHER): Payer: Medicare Other | Admitting: Physician Assistant

## 2016-08-12 ENCOUNTER — Encounter: Payer: Self-pay | Admitting: Physician Assistant

## 2016-08-12 VITALS — BP 130/80 | HR 58 | Ht 73.0 in | Wt 214.0 lb

## 2016-08-12 DIAGNOSIS — E785 Hyperlipidemia, unspecified: Secondary | ICD-10-CM

## 2016-08-12 DIAGNOSIS — F17200 Nicotine dependence, unspecified, uncomplicated: Secondary | ICD-10-CM

## 2016-08-12 DIAGNOSIS — I10 Essential (primary) hypertension: Secondary | ICD-10-CM

## 2016-08-12 DIAGNOSIS — I2510 Atherosclerotic heart disease of native coronary artery without angina pectoris: Secondary | ICD-10-CM

## 2016-08-12 MED ORDER — LISINOPRIL 5 MG PO TABS: 5.0000 mg | ORAL_TABLET | Freq: Every day | ORAL | 3 refills | Status: DC

## 2016-08-12 MED ORDER — METOPROLOL SUCCINATE ER 25 MG PO TB24: 25.0000 mg | ORAL_TABLET | Freq: Every day | ORAL | 3 refills | Status: DC

## 2016-08-12 NOTE — Patient Instructions (Addendum)
Medication Instructions:  No changes  Labwork: None   Testing/Procedures: None   Follow-Up: Your physician wants you to follow-up in: [place], [facility]  You will receive a reminder letter in the mail two months in advance. If you don't receive a letter, please call our office to schedule the follow-up appointment.   Any Other Special Instructions Will Be Listed Below (If Applicable).  If you need a refill on your cardiac medications before your next appointment, please call your pharmacy.
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
## 2016-08-29 ENCOUNTER — Other Ambulatory Visit: Payer: Self-pay | Admitting: Physician Assistant

## 2016-09-05 ENCOUNTER — Other Ambulatory Visit: Payer: Self-pay | Admitting: Physician Assistant

## 2016-09-12 ENCOUNTER — Other Ambulatory Visit: Payer: Self-pay | Admitting: Cardiology

## 2016-09-15 NOTE — Telephone Encounter (Signed)
Followed by End/Weaver

## 2016-11-18 ENCOUNTER — Encounter: Payer: Self-pay | Admitting: Physician Assistant

## 2017-03-31 ENCOUNTER — Telehealth: Payer: Self-pay | Admitting: Physician Assistant

## 2017-03-31 NOTE — Telephone Encounter (Signed)
   Primary Cardiologist: Nelva Bush, MD  Chart reviewed as part of pre-operative protocol coverage. Patient was contacted 03/31/2017 in reference to pre-operative risk assessment for pending surgery as outlined below.  Arthur Francis was last seen on 08/12/16 by Richardson Dopp PA-C.  Since that day, Arthur Francis has done well. He can complete more than 4.0 METS.  He states his procedure will consist of only local anesthetic and no IV sedation.  Therefore, based on ACC/AHA guidelines, the patient would be at acceptable risk for the planned procedure without further cardiovascular testing.   I will route this recommendation to the requesting party via Epic fax function and remove from pre-op pool.  Preop call back pool, please call office and make sure they received this documentation.  Please call with questions.  Arthur Lin Sita Mangen, PA 03/31/2017, 3:06 PM

## 2017-03-31 NOTE — Telephone Encounter (Signed)
I don't know what they are asking. Please clarify if they are asking a medication question or for medical clearance.

## 2017-03-31 NOTE — Telephone Encounter (Signed)
1. What dental office are you calling from? [facility]   2. What is your office phone and fax number? [phone]  3. What type of procedure is the patient having performed? Bottom teeth extracted   4. What date is procedure scheduled or is the patient there now? There now   5. What is your question (ex. Antibiotics prior to procedure, holding medication-we need to know how long dentist wants pt to hold med)?

## 2017-03-31 NOTE — Telephone Encounter (Signed)
Patient needs medical clearance per Dr Virgel Manifold office.

## 2017-03-31 NOTE — Telephone Encounter (Signed)
Faxed preop letter and verified by office staff notes were received.  

## 2017-06-07 ENCOUNTER — Other Ambulatory Visit: Payer: Self-pay | Admitting: Nurse Practitioner

## 2017-06-07 DIAGNOSIS — K7400 Hepatic fibrosis, unspecified: Secondary | ICD-10-CM

## 2017-06-14 ENCOUNTER — Other Ambulatory Visit: Payer: Medicare Other

## 2017-07-02 ENCOUNTER — Other Ambulatory Visit: Payer: Medicare Other

## 2017-07-23 ENCOUNTER — Other Ambulatory Visit: Payer: Medicare Other

## 2017-08-12 ENCOUNTER — Other Ambulatory Visit: Payer: Self-pay | Admitting: Physician Assistant

## 2017-09-03 ENCOUNTER — Other Ambulatory Visit: Payer: Medicare Other

## 2017-09-10 ENCOUNTER — Inpatient Hospital Stay: Admission: RE | Admit: 2017-09-10 | Payer: Medicare Other | Source: Ambulatory Visit

## 2017-09-22 ENCOUNTER — Ambulatory Visit: Payer: Medicare Other | Admitting: Physician Assistant

## 2017-11-02 ENCOUNTER — Encounter: Payer: Self-pay | Admitting: Physician Assistant

## 2017-11-02 ENCOUNTER — Ambulatory Visit (INDEPENDENT_AMBULATORY_CARE_PROVIDER_SITE_OTHER): Payer: Medicare Other | Admitting: Physician Assistant

## 2017-11-02 VITALS — BP 150/80 | HR 55 | Ht 73.0 in | Wt 189.1 lb

## 2017-11-02 DIAGNOSIS — F17200 Nicotine dependence, unspecified, uncomplicated: Secondary | ICD-10-CM | POA: Diagnosis not present

## 2017-11-02 DIAGNOSIS — I2510 Atherosclerotic heart disease of native coronary artery without angina pectoris: Secondary | ICD-10-CM

## 2017-11-02 DIAGNOSIS — I10 Essential (primary) hypertension: Secondary | ICD-10-CM | POA: Diagnosis not present

## 2017-11-02 DIAGNOSIS — E785 Hyperlipidemia, unspecified: Secondary | ICD-10-CM | POA: Diagnosis not present

## 2017-11-02 MED ORDER — LISINOPRIL 10 MG PO TABS: 10.0000 mg | ORAL_TABLET | Freq: Every day | ORAL | 3 refills | Status: DC
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
## 2017-11-02 NOTE — Patient Instructions (Signed)
Medication Instructions:  1. INCREASE LISINOPRIL TO 10 MG DAILY; NEW RX HAS BEEN SENT IN FOR THE 10 MG TABLET  Labwork: BMET TO BE DONE WITH PRIMARY CARE IN 2 WEEKS;,PLEASE HAVE RESULTS FAXED TO OUR OFFICE [phone] TO SCOTT WEAVER,, PAC   Testing/Procedures: NONE ORDERED TODAY  Follow-Up: SCOTT WEAVER, PAC IN 1 YEAR  Any Other Special Instructions Will Be Listed Below (If Applicable).     If you need a refill on your cardiac medications before your next appointment, please call your pharmacy.

## 2017-11-29 ENCOUNTER — Telehealth: Payer: Self-pay

## 2017-11-29 DIAGNOSIS — E875 Hyperkalemia: Secondary | ICD-10-CM

## 2017-11-29 NOTE — Telephone Encounter (Signed)
Patient aware of lab results. Per Richardson Dopp PA, The kidney function (creatinine) is normal. The potassium is slightly elevated (5.5). Recommendations: - Reduce dietary potassium. - Repeat BMET 1-2 weeks.   Patient verbalized understanding. Patient will go to [facility] in [place] next week for repeat BMET.

## 2017-11-29 NOTE — Telephone Encounter (Signed)
-----   Message from Liliane Shi, [place] sent at 11/29/2017  4:36 PM EDT ----- The kidney function (creatinine) is normal.  The potassium is slightly elevated (5.5). Recommendations:  - Reduce dietary potassium.  - Repeat BMET 1-2 weeks.  Richardson Dopp, PA-C    11/29/2017 4:35 PM

## 2017-12-04 ENCOUNTER — Telehealth: Payer: Self-pay | Admitting: Nurse Practitioner

## 2017-12-04 NOTE — Telephone Encounter (Signed)
Will ask office to reach out to patient to see if he has had follow up lab (BMET) per scott Kathlen Mody PA's recommendation.

## 2017-12-06 NOTE — Telephone Encounter (Signed)
I did try again later today to reach the pt in regards to lab work, recording customer not available at this time.

## 2017-12-06 NOTE — Telephone Encounter (Signed)
Tried to reach pt to see if he maybe had the repeat BMET done with PCP, if so if we could have faxed to our office [phone]. Phone recording says person not available at this time, Try again later.

## 2017-12-08 ENCOUNTER — Telehealth: Payer: Self-pay | Admitting: *Deleted

## 2017-12-08 LAB — BASIC METABOLIC PANEL
BUN / CREAT RATIO: 14 (ref 10–24)
BUN: 14 mg/dL (ref 8–27)
CHLORIDE: 102 mmol/L (ref 96–106)
CO2: 22 mmol/L (ref 20–29)
CREATININE: 1.01 mg/dL (ref 0.76–1.27)
Calcium: 9 mg/dL (ref 8.6–10.2)
GFR calc non Af Amer: 79 mL/min/{1.73_m2} (ref >59)
GFR, EST AFRICAN AMERICAN: 92 mL/min/{1.73_m2} (ref >59)
Glucose: 88 mg/dL (ref 65–99)
Potassium: 4.6 mmol/L (ref 3.5–5.2)
Sodium: 139 mmol/L (ref 134–144)

## 2017-12-08 NOTE — Telephone Encounter (Signed)
-----   Message from Liliane Shi, [place] sent at 12/08/2017  1:10 PM EDT ----- The kidney function (Creatinine) and potassium are normal. Continue current medications and follow up as planned.  Richardson Dopp, PA-C    12/08/2017 1:09 PM

## 2017-12-08 NOTE — Telephone Encounter (Signed)
Pt came in for his lab work.

## 2017-12-08 NOTE — Telephone Encounter (Signed)
Tried to reach pt to go over results, though recording states person not available at this time. Try your call gain later.

## 2017-12-10 ENCOUNTER — Telehealth: Payer: Self-pay | Admitting: *Deleted

## 2017-12-10 NOTE — Telephone Encounter (Signed)
Tried to reach pt to go over lab results. Recording states person at this number not available at this time, try your call again later. Cannot lmom.

## 2017-12-10 NOTE — Telephone Encounter (Signed)
-----   Message from Liliane Shi, [place] sent at 12/08/2017  1:10 PM EDT ----- The kidney function (Creatinine) and potassium are normal. Continue current medications and follow up as planned.  Richardson Dopp, PA-C    12/08/2017 1:09 PM

## 2017-12-14 ENCOUNTER — Encounter: Payer: Self-pay | Admitting: *Deleted

## 2017-12-14 ENCOUNTER — Telehealth: Payer: Self-pay | Admitting: *Deleted

## 2017-12-14 NOTE — Telephone Encounter (Signed)
Tried to reach pt today. Recording states person at this number not available at this time, try your call again later. Cannot lmom. I will send letter to the pt to call for his lab results.

## 2017-12-14 NOTE — Telephone Encounter (Signed)
-----   Message from Liliane Shi, [place] sent at 12/08/2017  1:10 PM EDT ----- The kidney function (Creatinine) and potassium are normal. Continue current medications and follow up as planned.  Richardson Dopp, PA-C    12/08/2017 1:09 PM

## 2017-12-17 MED ORDER — NITROGLYCERIN 0.4 MG SL SUBL: 0.4000 mg | SUBLINGUAL_TABLET | SUBLINGUAL | 3 refills | Status: DC | PRN

## 2017-12-17 NOTE — Addendum Note (Signed)
Addended by: Michae Kava on: 12/17/2017 04:46 PM   Modules accepted: Orders

## 2017-12-17 NOTE — Telephone Encounter (Signed)
I was able to s/w the pt today about his recent lab results. Pt thanked me for reaching out to him even though his lab was normal. Pt asked for a refill on his NTG, Rx has been sent to [facility]. Pt thanked me again for my call and help.

## 2017-12-17 NOTE — Telephone Encounter (Signed)
Patient called for lab results. Can be reached at [phone].

## 2018-07-28 ENCOUNTER — Other Ambulatory Visit: Payer: Self-pay | Admitting: Physician Assistant

## 2018-10-24 ENCOUNTER — Other Ambulatory Visit: Payer: Self-pay | Admitting: Physician Assistant

## 2018-10-25 ENCOUNTER — Other Ambulatory Visit: Payer: Self-pay | Admitting: Physician Assistant

## 2018-11-22 ENCOUNTER — Encounter: Payer: Self-pay | Admitting: Physician Assistant

## 2018-11-22 ENCOUNTER — Other Ambulatory Visit: Payer: Self-pay

## 2018-11-22 ENCOUNTER — Ambulatory Visit (INDEPENDENT_AMBULATORY_CARE_PROVIDER_SITE_OTHER): Payer: Medicare Other | Admitting: Physician Assistant

## 2018-11-22 VITALS — BP 126/72 | HR 62 | Ht 73.0 in | Wt 199.1 lb

## 2018-11-22 DIAGNOSIS — M79604 Pain in right leg: Secondary | ICD-10-CM

## 2018-11-22 DIAGNOSIS — Z720 Tobacco use: Secondary | ICD-10-CM | POA: Diagnosis not present

## 2018-11-22 DIAGNOSIS — I2510 Atherosclerotic heart disease of native coronary artery without angina pectoris: Secondary | ICD-10-CM | POA: Diagnosis not present

## 2018-11-22 DIAGNOSIS — I10 Essential (primary) hypertension: Secondary | ICD-10-CM | POA: Diagnosis not present

## 2018-11-22 DIAGNOSIS — M79605 Pain in left leg: Secondary | ICD-10-CM

## 2018-11-22 DIAGNOSIS — E785 Hyperlipidemia, unspecified: Secondary | ICD-10-CM
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
## 2018-11-22 NOTE — Patient Instructions (Signed)
Medication Instructions:  Your physician recommends that you continue on your current medications as directed. Please refer to the Current Medication list given to you today.  If you need a refill on your cardiac medications before your next appointment, please call your pharmacy.   Lab work: NONE ORDERED  TODAY   If you have labs (blood work) drawn today and your tests are completely normal, you will receive your results only by: Marland Kitchen MyChart Message (if you have MyChart) OR . A paper copy in the mail If you have any lab test that is abnormal or we need to change your treatment, we will call you to review the results.  Testing/Procedures: NONE ORDERED  TODAY    Follow-Up: At [facility], you and your health needs are our priority.  As part of our continuing mission to provide you with exceptional heart care, we have created designated Provider Care Teams.  These Care Teams include your primary Cardiologist (physician) and Advanced Practice Providers (APPs -  Physician Assistants and Nurse Practitioners) who all work together to provide you with the care you need, when you need it. You will need a follow up appointment in 1 years.  Please call our office 2 months in advance to schedule this appointment.  You may see Richardson Dopp PA-C     Any Other Special Instructions Will Be Listed Below (If Applicable).

## 2019-01-23 ENCOUNTER — Other Ambulatory Visit: Payer: Self-pay | Admitting: Physician Assistant

## 2019-03-15 ENCOUNTER — Other Ambulatory Visit: Payer: Self-pay

## 2019-03-15 ENCOUNTER — Encounter (HOSPITAL_COMMUNITY): Payer: Self-pay | Admitting: Emergency Medicine

## 2019-03-15 ENCOUNTER — Emergency Department (HOSPITAL_COMMUNITY)
Admission: EM | Admit: 2019-03-15 | Discharge: 2019-03-16 | Disposition: A | Discharge status: Home / Self Care | Payer: Medicare Other | Attending: Emergency Medicine | Admitting: Emergency Medicine

## 2019-03-15 DIAGNOSIS — Z7984 Long term (current) use of oral hypoglycemic drugs: Secondary | ICD-10-CM | POA: Diagnosis not present

## 2019-03-15 DIAGNOSIS — Z7982 Long term (current) use of aspirin: Secondary | ICD-10-CM | POA: Diagnosis not present

## 2019-03-15 DIAGNOSIS — M542 Cervicalgia: Secondary | ICD-10-CM

## 2019-03-15 DIAGNOSIS — Z79899 Other long term (current) drug therapy: Secondary | ICD-10-CM | POA: Insufficient documentation

## 2019-03-15 DIAGNOSIS — I10 Essential (primary) hypertension: Secondary | ICD-10-CM | POA: Insufficient documentation

## 2019-03-15 DIAGNOSIS — I2510 Atherosclerotic heart disease of native coronary artery without angina pectoris: Secondary | ICD-10-CM | POA: Insufficient documentation

## 2019-03-15 DIAGNOSIS — W19XXXA Unspecified fall, initial encounter: Secondary | ICD-10-CM | POA: Insufficient documentation

## 2019-03-15 DIAGNOSIS — M549 Dorsalgia, unspecified: Secondary | ICD-10-CM | POA: Diagnosis not present

## 2019-03-15 DIAGNOSIS — F17210 Nicotine dependence, cigarettes, uncomplicated: Secondary | ICD-10-CM | POA: Diagnosis not present

## 2019-03-15 NOTE — ED Triage Notes (Signed)
Patient fell from a ladder 6 weeks ago , advised by his PCP to go to ER for MRI due to persistent posterior neck/low back pain .

## 2019-03-16 ENCOUNTER — Emergency Department (HOSPITAL_COMMUNITY): Payer: Medicare Other

## 2019-03-16 DIAGNOSIS — M542 Cervicalgia: Secondary | ICD-10-CM | POA: Diagnosis not present
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
## 2019-03-16 NOTE — Discharge Instructions (Signed)
CT today did not show a spine fracture.  You can wear the soft collar if it feels better, continue your home pain medication. You may need to follow-up with the spine specialist again if you continue to have issues. Return here for any new/acute changes.

## 2019-03-16 NOTE — ED Notes (Signed)
Talked with pt about putting on a C-collar. Pt stated that his doctor advised not to put one on until the MRI was done and the doctor here knew what was going on.

## 2019-03-16 NOTE — ED Notes (Signed)
Muriel Wilber wife [phone] looking for an update on the patient

## 2019-04-27 ENCOUNTER — Other Ambulatory Visit: Payer: Self-pay | Admitting: Physician Assistant

## 2019-09-20 ENCOUNTER — Other Ambulatory Visit: Payer: Self-pay | Admitting: Nurse Practitioner

## 2019-09-20 DIAGNOSIS — K7581 Nonalcoholic steatohepatitis (NASH): Secondary | ICD-10-CM

## 2019-09-28 ENCOUNTER — Ambulatory Visit
Admission: RE | Admit: 2019-09-28 | Discharge: 2019-09-28 | Disposition: A | Discharge status: Home / Self Care | Payer: Medicare Other | Source: Ambulatory Visit | Attending: Nurse Practitioner | Admitting: Nurse Practitioner

## 2019-09-28 DIAGNOSIS — K7581 Nonalcoholic steatohepatitis (NASH): Secondary | ICD-10-CM

## 2019-10-18 ENCOUNTER — Other Ambulatory Visit: Payer: Self-pay | Admitting: Physician Assistant

## 2019-11-15 ENCOUNTER — Other Ambulatory Visit: Payer: Self-pay | Admitting: Physician Assistant

## 2019-11-30 ENCOUNTER — Other Ambulatory Visit: Payer: Self-pay | Admitting: Cardiovascular Disease

## 2019-12-07 ENCOUNTER — Other Ambulatory Visit: Payer: Self-pay | Admitting: Cardiovascular Disease

## 2019-12-07 ENCOUNTER — Other Ambulatory Visit: Payer: Self-pay | Admitting: Physician Assistant

## 2020-01-30 ENCOUNTER — Other Ambulatory Visit: Payer: Self-pay | Admitting: Physician Assistant

## 2020-02-29 ENCOUNTER — Other Ambulatory Visit: Payer: Self-pay | Admitting: Internal Medicine

## 2020-03-12 ENCOUNTER — Other Ambulatory Visit: Payer: Self-pay | Admitting: Internal Medicine

## 2020-03-25 ENCOUNTER — Ambulatory Visit: Payer: Self-pay | Admitting: Orthopedic Surgery

## 2020-03-26 ENCOUNTER — Telehealth: Payer: Self-pay

## 2020-03-26 NOTE — Telephone Encounter (Signed)
   [facility] Pre-operative Risk Assessment   Request for surgical clearance:  1. What type of surgery is being performed? ANTERIOR CERVICAL DECOMPRESSION/DISCECTOMY FUSION C4-7  2. When is this surgery scheduled? 05/01/20  3. What type of clearance is required (medical clearance vs. Pharmacy clearance to hold med vs. Both)? Both  4. Are there any medications that need to be held prior to surgery and how long? Not noted  5. Practice name and name of physician performing surgery? Dr. Rolena Infante  6. What is the office phone number? [phone]   7.   What is the office fax number? [phone]  8.   Anesthesia type (None, local, MAC, general) ? Not noted   Edman Circle 03/26/2020, 5:19 PM  _________________________________________________________________   (provider comments below)

## 2020-03-27 ENCOUNTER — Other Ambulatory Visit: Payer: Self-pay | Admitting: Physician Assistant

## 2020-03-27 NOTE — Telephone Encounter (Signed)
Attempted to schedule f/u visit with Richardson Dopp for surgical clarance. Unable to leave VM because the number just kept ringing

## 2020-03-27 NOTE — Telephone Encounter (Signed)
Pt need appt with Dr Aundra Dubin or an APP

## 2020-03-27 NOTE — Telephone Encounter (Signed)
   Primary Cardiologist: Nelva Bush, MD  Chart reviewed as part of pre-operative protocol coverage. Because of Arthur Francis's past medical history and time since last visit, he will require a follow-up visit in order to better assess preoperative cardiovascular risk.  Pre-op covering staff: - Please schedule appointment and call patient to inform them. If patient already had an upcoming appointment within acceptable timeframe, please add "pre-op clearance" to the appointment notes so provider is aware. - Please contact requesting surgeon's office via preferred method (i.e, phone, fax) to inform them of need for appointment prior to surgery.  If applicable, this message will also be routed to pharmacy pool and/or primary cardiologist for input on holding anticoagulant/antiplatelet agent as requested below so that this information is available to the clearing provider at time of patient's appointment.   Kerin Ransom, PA-C  03/27/2020, 8:44 AM

## 2020-03-28 NOTE — Telephone Encounter (Signed)
Called the requesting office of [facility] and informed the representative that the patient needs an appointment for cardiac clearance and that attempts have been made to contact him. Asked if their office were to get in contact with patient to please have him give our office a call to get scheduled for a preop clearance appointment. She thanked me for calling and stated she will pass the information along.

## 2020-03-29 NOTE — Telephone Encounter (Signed)
Patient is scheduled to see Truitt Merle, NP on 04/16/20 at 3:45 PM.

## 2020-04-01 ENCOUNTER — Other Ambulatory Visit: Payer: Self-pay | Admitting: Physician Assistant
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
## 2020-04-05 ENCOUNTER — Ambulatory Visit: Payer: Self-pay | Admitting: Orthopedic Surgery
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
## 2020-04-16 ENCOUNTER — Ambulatory Visit (INDEPENDENT_AMBULATORY_CARE_PROVIDER_SITE_OTHER): Payer: 59 | Admitting: Nurse Practitioner

## 2020-04-16 ENCOUNTER — Other Ambulatory Visit: Payer: Self-pay

## 2020-04-16 ENCOUNTER — Ambulatory Visit: Payer: Medicare Other | Admitting: Nurse Practitioner

## 2020-04-16 ENCOUNTER — Encounter: Payer: Self-pay | Admitting: Nurse Practitioner

## 2020-04-16 VITALS — BP 110/60 | HR 61 | Ht 73.0 in | Wt 188.0 lb

## 2020-04-16 DIAGNOSIS — I10 Essential (primary) hypertension: Secondary | ICD-10-CM

## 2020-04-16 DIAGNOSIS — I2510 Atherosclerotic heart disease of native coronary artery without angina pectoris: Secondary | ICD-10-CM | POA: Diagnosis not present

## 2020-04-16 DIAGNOSIS — Z01810 Encounter for preprocedural cardiovascular examination: Secondary | ICD-10-CM | POA: Diagnosis not present

## 2020-04-16 DIAGNOSIS — E785 Hyperlipidemia, unspecified: Secondary | ICD-10-CM

## 2020-04-16 NOTE — Patient Instructions (Addendum)
After Visit Summary:  We will be checking the following labs today - NONE   Medication Instructions:    Continue with your current medicines.    If you need a refill on your cardiac medications before your next appointment, please call your pharmacy.     Testing/Procedures To Be Arranged:  N/A  Follow-Up:  See Margaret Pyle later this fall - September/October   At [facility], you and your health needs are our priority.  As part of our continuing mission to provide you with exceptional heart care, we have created designated Provider Care Teams.  These Care Teams include your primary Cardiologist (physician) and Advanced Practice Providers (APPs -  Physician Assistants and Nurse Practitioners) who all work together to provide you with the care you need, when you need it.  Special Instructions:  . Stay safe, wash your hands for at least 20 seconds and wear a mask when needed.  . It was good to talk with you today.  . I will send a note to Dr. Rolena Infante  . Work on your smoking!   Call the [place] office at [phone] if you have any questions, problems or concerns.

## 2020-04-26 NOTE — Progress Notes (Signed)
Surgical Instructions  Your procedure is scheduled on Wednesday, March 9th.  Report to [facility] Main Entrance "A" at 11:05 A.M., then check in with the Admitting office.  Call this number if you have problems the morning of surgery:  [phone]   If you have any questions prior to your surgery date call [phone]: Open Monday-Friday 8am-4pm   Remember:  Do not eat or drink after midnight the night before your surgery    Take these medicines the morning of surgery with A SIP OF WATER  metoprolol succinate (TOPROL-XL) pantoprazole (PROTONIX)  rosuvastatin (CRESTOR)  valACYclovir (VALTREX)   If needed: gabapentin (NEURONTIN), nitroGLYCERIN (NITROSTAT), oxyCODONE (ROXICODONE), tamsulosin (FLOMAX)    Follow your surgeon's instructions on when to stop Aspirin.  If no instructions were given by your surgeon then you will need to call the office to get those instructions.    As of today, STOP taking meloxicam (MOBIC),  Aleve, Naproxen, Ibuprofen, Motrin, Advil, Goody's, BC's, all herbal medications, fish oil, and all vitamins.                     Do not wear jewelry.            Do not wear lotions, powders,colognes, or deodorant.            Men may shave face and neck.            Do not bring valuables to the hospital.            [facility] is not responsible for any belongings or valuables.  Do NOT Smoke (Tobacco/Vaping) or drink Alcohol 24 hours prior to your procedure If you use a CPAP at night, you may bring all equipment for your overnight stay.   Contacts, glasses, dentures or bridgework may not be worn into surgery, please bring cases for these belongings   For patients admitted to the hospital, discharge time will be determined by your treatment team.   Patients discharged the day of surgery will not be allowed to drive home, and someone needs to stay with them for 24 hours.  Special instructions:   [place]- Preparing For Surgery  Before surgery, you can play an  important role. Because skin is not sterile, your skin needs to be as free of germs as possible. You can reduce the number of germs on your skin by washing with CHG (chlorahexidine gluconate) Soap before surgery.  CHG is an antiseptic cleaner which kills germs and bonds with the skin to continue killing germs even after washing.    Oral Hygiene is also important to reduce your risk of infection.  Remember - BRUSH YOUR TEETH THE MORNING OF SURGERY WITH YOUR REGULAR TOOTHPASTE  Please do not use if you have an allergy to CHG or antibacterial soaps. If your skin becomes reddened/irritated stop using the CHG.  Do not shave (including legs and underarms) for at least 48 hours prior to first CHG shower. It is OK to shave your face.  Please follow these instructions carefully.   1. Shower the NIGHT BEFORE SURGERY and the MORNING OF SURGERY  2. If you chose to wash your hair, wash your hair first as usual with your normal shampoo. After you shampoo, rinse your hair and body thoroughly to remove the shampoo.  3. Wash Face and genitals (private parts) with your normal soap.   4. Use CHG Soap as you would any other liquid soap. You can apply CHG directly to the skin  and wash gently with a scrungie or a clean washcloth.   5. Apply the CHG Soap to your body ONLY FROM THE NECK DOWN.  Do not use on open wounds or open sores. Avoid contact with your eyes, ears, mouth and genitals (private parts). Wash Face and genitals (private parts)  with your normal soap.   6. Wash thoroughly, paying special attention to the area where your surgery will be performed.  7. Thoroughly rinse your body with warm water from the neck down.  8. DO NOT shower/wash with your normal soap after using and rinsing off the CHG Soap.  9. Pat yourself dry with a CLEAN TOWEL.  10. Wear CLEAN PAJAMAS to bed the night before surgery  11. Place CLEAN SHEETS on your bed the night before your surgery  12. DO NOT SLEEP WITH PETS.  Day  of Surgery: Shower with CHG soap Wear Clean/Comfortable clothing the morning of surgery Do not apply any deodorants/lotions.   Remember to brush your teeth WITH YOUR REGULAR TOOTHPASTE.   Please read over the following fact sheets that you were given.

## 2020-04-29 ENCOUNTER — Other Ambulatory Visit: Payer: Self-pay

## 2020-04-29 ENCOUNTER — Encounter (HOSPITAL_COMMUNITY): Payer: Self-pay

## 2020-04-29 ENCOUNTER — Encounter (HOSPITAL_COMMUNITY)
Admission: RE | Admit: 2020-04-29 | Discharge: 2020-04-29 | Disposition: A | Discharge status: Home / Self Care | Payer: 59 | Source: Ambulatory Visit | Attending: Orthopedic Surgery | Admitting: Orthopedic Surgery

## 2020-04-29 ENCOUNTER — Ambulatory Visit (HOSPITAL_COMMUNITY)
Admission: RE | Admit: 2020-04-29 | Discharge: 2020-04-29 | Disposition: A | Discharge status: Home / Self Care | Payer: 59 | Source: Ambulatory Visit | Attending: Orthopedic Surgery | Admitting: Orthopedic Surgery

## 2020-04-29 ENCOUNTER — Other Ambulatory Visit (HOSPITAL_COMMUNITY)
Admission: RE | Admit: 2020-04-29 | Discharge: 2020-04-29 | Disposition: A | Discharge status: Home / Self Care | Payer: 59 | Source: Ambulatory Visit | Attending: Orthopedic Surgery | Admitting: Orthopedic Surgery

## 2020-04-29 DIAGNOSIS — Z20822 Contact with and (suspected) exposure to covid-19: Secondary | ICD-10-CM | POA: Insufficient documentation

## 2020-04-29 DIAGNOSIS — Z01818 Encounter for other preprocedural examination: Secondary | ICD-10-CM

## 2020-04-29 DIAGNOSIS — Z01812 Encounter for preprocedural laboratory examination: Secondary | ICD-10-CM | POA: Insufficient documentation

## 2020-04-29 HISTORY — DX: Personal history of other diseases of the digestive system: Z87.19

## 2020-04-29 LAB — BASIC METABOLIC PANEL
Anion gap: 9 (ref 5–15)
BUN: 12 mg/dL (ref 8–23)
CO2: 23 mmol/L (ref 22–32)
Calcium: 9.5 mg/dL (ref 8.9–10.3)
Chloride: 107 mmol/L (ref 98–111)
Creatinine, Ser: 1.11 mg/dL (ref 0.61–1.24)
GFR, Estimated: >60 mL/min (ref >60)
Glucose, Bld: 90 mg/dL (ref 70–99)
Potassium: 4.2 mmol/L (ref 3.5–5.1)
Sodium: 139 mmol/L (ref 135–145)

## 2020-04-29 LAB — URINALYSIS, ROUTINE W REFLEX MICROSCOPIC
Bilirubin Urine: NEGATIVE
Glucose, UA: NEGATIVE mg/dL
Hgb urine dipstick: NEGATIVE
Ketones, ur: NEGATIVE mg/dL
Leukocytes,Ua: NEGATIVE
Nitrite: NEGATIVE
Protein, ur: NEGATIVE mg/dL
Specific Gravity, Urine: 1.009 (ref 1.005–1.030)
pH: 6 (ref 5.0–8.0)

## 2020-04-29 LAB — SARS CORONAVIRUS 2 (TAT 6-24 HRS): SARS Coronavirus 2: NEGATIVE

## 2020-04-29 LAB — TYPE AND SCREEN
ABO/RH(D): O POS
Antibody Screen: NEGATIVE

## 2020-04-29 LAB — CBC
HCT: 45.3 % (ref 39.0–52.0)
Hemoglobin: 14.9 g/dL (ref 13.0–17.0)
MCH: 32.8 pg (ref 26.0–34.0)
MCHC: 32.9 g/dL (ref 30.0–36.0)
MCV: 99.8 fL (ref 80.0–100.0)
Platelets: 230 10*3/uL (ref 150–400)
RBC: 4.54 MIL/uL (ref 4.22–5.81)
RDW: 13.2 % (ref 11.5–15.5)
WBC: 8.2 10*3/uL (ref 4.0–10.5)
nRBC: 0 % (ref 0.0–0.2)

## 2020-04-29 LAB — SURGICAL PCR SCREEN
MRSA, PCR: NEGATIVE
Staphylococcus aureus: NEGATIVE

## 2020-04-29 LAB — APTT: aPTT: 33 seconds (ref 24–36)

## 2020-04-29 LAB — PROTIME-INR
INR: 1 (ref 0.8–1.2)
Prothrombin Time: 13.1 seconds (ref 11.4–15.2)

## 2020-04-29 NOTE — Progress Notes (Signed)
PCP - Donnetta Hutching Cardiologist - Richardson Dopp  PPM/ICD - n/a Device Orders -  Rep Notified -   Chest x-ray - 04/29/20 EKG - 03/27/20 Stress Test - 03/13/2015 ECHO - 05/29/2015 Cardiac Cath - 2004 with intervention  Sleep Study - n/a CPAP -   Fasting Blood Sugar -  n/a Checks Blood Sugar _____ times a day  Blood Thinner Instructions: Aspirin Instructions: stopped aspirin on 04/25/2020  ERAS Protcol - npo after midnight PRE-SURGERY Ensure or G2-   COVID TEST- after PAT appointment   Anesthesia review: yes, per order  Patient denies shortness of breath, fever, cough and chest pain at PAT appointment   All instructions explained to the patient, with a verbal understanding of the material. Patient agrees to go over the instructions while at home for a better understanding. Patient also instructed to self quarantine after being tested for COVID-19. The opportunity to ask questions was provided.

## 2020-04-30 ENCOUNTER — Other Ambulatory Visit: Payer: Self-pay | Admitting: Physician Assistant

## 2020-04-30 ENCOUNTER — Ambulatory Visit: Payer: Self-pay | Admitting: Orthopedic Surgery

## 2020-04-30 LAB — HEPATIC FUNCTION PANEL
ALT: 17 U/L (ref 0–44)
AST: 21 U/L (ref 15–41)
Albumin: 4.2 g/dL (ref 3.5–5.0)
Alkaline Phosphatase: 41 U/L (ref 38–126)
Bilirubin, Direct: 0.1 mg/dL (ref 0.0–0.2)
Indirect Bilirubin: 0.5 mg/dL (ref 0.3–0.9)
Total Bilirubin: 0.6 mg/dL (ref 0.3–1.2)
Total Protein: 7.2 g/dL (ref 6.5–8.1)
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
## 2020-05-01 ENCOUNTER — Inpatient Hospital Stay (HOSPITAL_COMMUNITY): Payer: 59

## 2020-05-01 ENCOUNTER — Inpatient Hospital Stay (HOSPITAL_COMMUNITY): Payer: 59 | Admitting: Vascular Surgery

## 2020-05-01 ENCOUNTER — Other Ambulatory Visit: Payer: Self-pay

## 2020-05-01 ENCOUNTER — Inpatient Hospital Stay (HOSPITAL_COMMUNITY)
Admission: RE | Admit: 2020-05-01 | Discharge: 2020-05-02 | DRG: 473 | Disposition: A | Discharge status: Home / Self Care | Payer: 59 | Attending: Orthopedic Surgery | Admitting: Orthopedic Surgery

## 2020-05-01 ENCOUNTER — Inpatient Hospital Stay (HOSPITAL_COMMUNITY)
Admission: RE | Disposition: A | Discharge status: Home / Self Care | Payer: Self-pay | Source: Home / Self Care | Attending: Orthopedic Surgery

## 2020-05-01 ENCOUNTER — Encounter (HOSPITAL_COMMUNITY): Payer: Self-pay | Admitting: Orthopedic Surgery

## 2020-05-01 DIAGNOSIS — Z885 Allergy status to narcotic agent status: Secondary | ICD-10-CM

## 2020-05-01 DIAGNOSIS — I2510 Atherosclerotic heart disease of native coronary artery without angina pectoris: Secondary | ICD-10-CM | POA: Diagnosis present

## 2020-05-01 DIAGNOSIS — Z87891 Personal history of nicotine dependence: Secondary | ICD-10-CM

## 2020-05-01 DIAGNOSIS — Z8619 Personal history of other infectious and parasitic diseases: Secondary | ICD-10-CM

## 2020-05-01 DIAGNOSIS — G8929 Other chronic pain: Secondary | ICD-10-CM | POA: Diagnosis present

## 2020-05-01 DIAGNOSIS — Z79899 Other long term (current) drug therapy: Secondary | ICD-10-CM

## 2020-05-01 DIAGNOSIS — Z888 Allergy status to other drugs, medicaments and biological substances status: Secondary | ICD-10-CM

## 2020-05-01 DIAGNOSIS — M4802 Spinal stenosis, cervical region: Secondary | ICD-10-CM | POA: Diagnosis present

## 2020-05-01 DIAGNOSIS — Z20822 Contact with and (suspected) exposure to covid-19: Secondary | ICD-10-CM | POA: Diagnosis present

## 2020-05-01 DIAGNOSIS — Z955 Presence of coronary angioplasty implant and graft: Secondary | ICD-10-CM

## 2020-05-01 DIAGNOSIS — M2578 Osteophyte, vertebrae: Secondary | ICD-10-CM | POA: Diagnosis present

## 2020-05-01 DIAGNOSIS — M50123 Cervical disc disorder at C6-C7 level with radiculopathy: Principal | ICD-10-CM | POA: Diagnosis present

## 2020-05-01 DIAGNOSIS — I252 Old myocardial infarction: Secondary | ICD-10-CM

## 2020-05-01 DIAGNOSIS — M5020 Other cervical disc displacement, unspecified cervical region: Secondary | ICD-10-CM | POA: Diagnosis present

## 2020-05-01 DIAGNOSIS — Z419 Encounter for procedure for purposes other than remedying health state, unspecified: Secondary | ICD-10-CM

## 2020-05-01 DIAGNOSIS — Z7982 Long term (current) use of aspirin: Secondary | ICD-10-CM

## 2020-05-01 HISTORY — PX: ANTERIOR CERVICAL DECOMP/DISCECTOMY FUSION: SHX1161

## 2020-05-01 LAB — ABO/RH: ABO/RH(D): O POS

## 2020-05-01 SURGERY — ANTERIOR CERVICAL DECOMPRESSION/DISCECTOMY FUSION 3 LEVELS
Anesthesia: General | Site: Spine Cervical

## 2020-05-01 MED ORDER — THROMBIN 20000 UNITS EX SOLR
CUTANEOUS | Status: DC | PRN
  Administered 2020-05-01: 20 mL

## 2020-05-01 MED ORDER — ONDANSETRON HCL 4 MG/2ML IJ SOLN
INTRAMUSCULAR | Status: DC | PRN
  Administered 2020-05-01: 4 mg via INTRAVENOUS

## 2020-05-01 MED ORDER — FENTANYL CITRATE (PF) 100 MCG/2ML IJ SOLN
INTRAMUSCULAR | Status: DC | PRN
  Administered 2020-05-01 (×3): 50 ug via INTRAVENOUS
  Administered 2020-05-01: 100 ug via INTRAVENOUS

## 2020-05-01 MED ORDER — SUGAMMADEX SODIUM 200 MG/2ML IV SOLN
INTRAVENOUS | Status: DC | PRN
  Administered 2020-05-01: 200 mg via INTRAVENOUS

## 2020-05-01 MED ORDER — PHENYLEPHRINE HCL-NACL 10-0.9 MG/250ML-% IV SOLN
INTRAVENOUS | Status: DC | PRN
  Administered 2020-05-01: 25 ug/min via INTRAVENOUS

## 2020-05-01 MED ORDER — PHENYLEPHRINE 40 MCG/ML (10ML) SYRINGE FOR IV PUSH (FOR BLOOD PRESSURE SUPPORT)
PREFILLED_SYRINGE | INTRAVENOUS | Status: DC | PRN
  Administered 2020-05-01: 120 ug via INTRAVENOUS

## 2020-05-01 MED ORDER — OXYCODONE HCL 5 MG PO TABS
15.0000 mg | ORAL_TABLET | Freq: Four times a day (QID) | ORAL | Status: DC | PRN
  Administered 2020-05-01 – 2020-05-02 (×4): 15 mg via ORAL
  Filled 2020-05-01 (×5): qty 3

## 2020-05-01 MED ORDER — ONDANSETRON HCL 4 MG/2ML IJ SOLN: 4.0000 mg | Freq: Four times a day (QID) | INTRAMUSCULAR | Status: DC | PRN

## 2020-05-01 MED ORDER — LACTATED RINGERS IV SOLN: INTRAVENOUS | Status: DC

## 2020-05-01 MED ORDER — DOCUSATE SODIUM 100 MG PO CAPS
100.0000 mg | ORAL_CAPSULE | Freq: Two times a day (BID) | ORAL | Status: DC
  Administered 2020-05-01 – 2020-05-02 (×2): 100 mg via ORAL
  Filled 2020-05-01 (×2): qty 1

## 2020-05-01 MED ORDER — PROPOFOL 10 MG/ML IV BOLUS
INTRAVENOUS | Status: AC
  Filled 2020-05-01: qty 20

## 2020-05-01 MED ORDER — CHLORHEXIDINE GLUCONATE 0.12 % MT SOLN
15.0000 mL | Freq: Once | OROMUCOSAL | Status: AC
  Administered 2020-05-01: 15 mL via OROMUCOSAL
  Filled 2020-05-01: qty 15

## 2020-05-01 MED ORDER — LIDOCAINE 2% (20 MG/ML) 5 ML SYRINGE
INTRAMUSCULAR | Status: DC | PRN
  Administered 2020-05-01: 80 mg via INTRAVENOUS

## 2020-05-01 MED ORDER — NITROGLYCERIN 0.4 MG SL SUBL: 0.4000 mg | SUBLINGUAL_TABLET | SUBLINGUAL | Status: DC | PRN

## 2020-05-01 MED ORDER — PHENOL 1.4 % MT LIQD: 1.0000 | OROMUCOSAL | Status: DC | PRN

## 2020-05-01 MED ORDER — PANTOPRAZOLE SODIUM 40 MG PO TBEC
40.0000 mg | DELAYED_RELEASE_TABLET | Freq: Every day | ORAL | Status: DC
  Administered 2020-05-02: 40 mg via ORAL
  Filled 2020-05-01: qty 1

## 2020-05-01 MED ORDER — METOPROLOL SUCCINATE ER 25 MG PO TB24
25.0000 mg | ORAL_TABLET | Freq: Every day | ORAL | Status: DC
  Administered 2020-05-02: 25 mg via ORAL
  Filled 2020-05-01: qty 1

## 2020-05-01 MED ORDER — GABAPENTIN 300 MG PO CAPS
400.0000 mg | ORAL_CAPSULE | Freq: Once | ORAL | Status: AC
  Administered 2020-05-01: 400 mg via ORAL
  Filled 2020-05-01: qty 1

## 2020-05-01 MED ORDER — POLYETHYLENE GLYCOL 3350 17 G PO PACK
17.0000 g | PACK | Freq: Every day | ORAL | Status: DC | PRN
Start: 2020-05-01 — End: 2020-05-02

## 2020-05-01 MED ORDER — BUPIVACAINE-EPINEPHRINE (PF) 0.25% -1:200000 IJ SOLN
INTRAMUSCULAR | Status: AC
  Filled 2020-05-01: qty 30

## 2020-05-01 MED ORDER — AMITRIPTYLINE HCL 50 MG PO TABS
100.0000 mg | ORAL_TABLET | Freq: Every day | ORAL | Status: DC
  Administered 2020-05-01: 100 mg via ORAL
  Filled 2020-05-01 (×2): qty 2

## 2020-05-01 MED ORDER — ONDANSETRON HCL 4 MG/2ML IJ SOLN
INTRAMUSCULAR | Status: AC
  Filled 2020-05-01: qty 2

## 2020-05-01 MED ORDER — DEXAMETHASONE SODIUM PHOSPHATE 10 MG/ML IJ SOLN
INTRAMUSCULAR | Status: AC
  Filled 2020-05-01: qty 1

## 2020-05-01 MED ORDER — MENTHOL 3 MG MT LOZG
1.0000 | LOZENGE | OROMUCOSAL | Status: DC | PRN
  Filled 2020-05-01: qty 9

## 2020-05-01 MED ORDER — PROMETHAZINE HCL 25 MG/ML IJ SOLN: 6.2500 mg | INTRAMUSCULAR | Status: DC | PRN

## 2020-05-01 MED ORDER — ACETAMINOPHEN 650 MG RE SUPP: 650.0000 mg | RECTAL | Status: DC | PRN

## 2020-05-01 MED ORDER — ONDANSETRON HCL 4 MG PO TABS: 4.0000 mg | ORAL_TABLET | Freq: Four times a day (QID) | ORAL | Status: DC | PRN

## 2020-05-01 MED ORDER — BUPIVACAINE-EPINEPHRINE 0.25% -1:200000 IJ SOLN
INTRAMUSCULAR | Status: DC | PRN
  Administered 2020-05-01: 10 mL
  Administered 2020-05-01: 20 mL

## 2020-05-01 MED ORDER — DEXAMETHASONE SODIUM PHOSPHATE 10 MG/ML IJ SOLN
INTRAMUSCULAR | Status: DC | PRN
  Administered 2020-05-01: 10 mg via INTRAVENOUS

## 2020-05-01 MED ORDER — HEMOSTATIC AGENTS (NO CHARGE) OPTIME
TOPICAL | Status: DC | PRN
  Administered 2020-05-01: 1 via TOPICAL

## 2020-05-01 MED ORDER — ACETAMINOPHEN 500 MG PO TABS
1000.0000 mg | ORAL_TABLET | Freq: Once | ORAL | Status: AC
  Administered 2020-05-01: 1000 mg via ORAL
  Filled 2020-05-01: qty 2

## 2020-05-01 MED ORDER — AMITRIPTYLINE HCL 100 MG PO TABS
100.0000 mg | ORAL_TABLET | Freq: Every day | ORAL | Status: DC
  Filled 2020-05-01: qty 1

## 2020-05-01 MED ORDER — PROPOFOL 10 MG/ML IV BOLUS
INTRAVENOUS | Status: DC | PRN
  Administered 2020-05-01: 150 mg via INTRAVENOUS

## 2020-05-01 MED ORDER — FENTANYL CITRATE (PF) 100 MCG/2ML IJ SOLN
25.0000 ug | INTRAMUSCULAR | Status: DC | PRN
  Administered 2020-05-01: 50 ug via INTRAVENOUS

## 2020-05-01 MED ORDER — MIDAZOLAM HCL 2 MG/2ML IJ SOLN
INTRAMUSCULAR | Status: AC
  Filled 2020-05-01: qty 2

## 2020-05-01 MED ORDER — ROCURONIUM BROMIDE 10 MG/ML (PF) SYRINGE
PREFILLED_SYRINGE | INTRAVENOUS | Status: AC
  Filled 2020-05-01: qty 10

## 2020-05-01 MED ORDER — SODIUM CHLORIDE 0.9% FLUSH: 3.0000 mL | INTRAVENOUS | Status: DC | PRN

## 2020-05-01 MED ORDER — THROMBIN (RECOMBINANT) 20000 UNITS EX SOLR
CUTANEOUS | Status: AC
  Filled 2020-05-01: qty 20000

## 2020-05-01 MED ORDER — KETAMINE HCL 10 MG/ML IJ SOLN
INTRAMUSCULAR | Status: DC | PRN
  Administered 2020-05-01: 25 mg via INTRAVENOUS
  Administered 2020-05-01: 10 mg via INTRAVENOUS
  Administered 2020-05-01: 5 mg via INTRAVENOUS
  Administered 2020-05-01: 10 mg via INTRAVENOUS

## 2020-05-01 MED ORDER — LIDOCAINE 2% (20 MG/ML) 5 ML SYRINGE
INTRAMUSCULAR | Status: AC
  Filled 2020-05-01: qty 5

## 2020-05-01 MED ORDER — ROCURONIUM BROMIDE 10 MG/ML (PF) SYRINGE
PREFILLED_SYRINGE | INTRAVENOUS | Status: DC | PRN
  Administered 2020-05-01: 20 mg via INTRAVENOUS
  Administered 2020-05-01: 30 mg via INTRAVENOUS
  Administered 2020-05-01: 20 mg via INTRAVENOUS
  Administered 2020-05-01: 60 mg via INTRAVENOUS

## 2020-05-01 MED ORDER — 0.9 % SODIUM CHLORIDE (POUR BTL) OPTIME
TOPICAL | Status: DC | PRN
  Administered 2020-05-01 (×2): 1000 mL

## 2020-05-01 MED ORDER — KETAMINE HCL 50 MG/5ML IJ SOSY
PREFILLED_SYRINGE | INTRAMUSCULAR | Status: AC
  Filled 2020-05-01: qty 5

## 2020-05-01 MED ORDER — ROSUVASTATIN CALCIUM 20 MG PO TABS
40.0000 mg | ORAL_TABLET | Freq: Every day | ORAL | Status: DC
  Administered 2020-05-02: 40 mg via ORAL
  Filled 2020-05-01: qty 2

## 2020-05-01 MED ORDER — ORAL CARE MOUTH RINSE: 15.0000 mL | Freq: Once | OROMUCOSAL | Status: AC

## 2020-05-01 MED ORDER — FLEET ENEMA 7-19 GM/118ML RE ENEM: 1.0000 | ENEMA | Freq: Once | RECTAL | Status: DC | PRN

## 2020-05-01 MED ORDER — FENTANYL CITRATE (PF) 250 MCG/5ML IJ SOLN
INTRAMUSCULAR | Status: AC
  Filled 2020-05-01: qty 5

## 2020-05-01 MED ORDER — CEFAZOLIN SODIUM-DEXTROSE 1-4 GM/50ML-% IV SOLN
1.0000 g | Freq: Three times a day (TID) | INTRAVENOUS | Status: AC
  Administered 2020-05-01: 1 g via INTRAVENOUS
  Filled 2020-05-01: qty 50

## 2020-05-01 MED ORDER — TAMSULOSIN HCL 0.4 MG PO CAPS: 0.4000 mg | ORAL_CAPSULE | Freq: Every day | ORAL | Status: DC | PRN

## 2020-05-01 MED ORDER — FENTANYL CITRATE (PF) 100 MCG/2ML IJ SOLN
INTRAMUSCULAR | Status: AC
  Administered 2020-05-01: 50 ug via INTRAVENOUS
  Filled 2020-05-01: qty 2

## 2020-05-01 MED ORDER — TRANEXAMIC ACID-NACL 1000-0.7 MG/100ML-% IV SOLN
INTRAVENOUS | Status: AC
  Filled 2020-05-01: qty 100

## 2020-05-01 MED ORDER — LACTATED RINGERS IV SOLN: INTRAVENOUS | Status: DC | PRN

## 2020-05-01 MED ORDER — CEFAZOLIN SODIUM-DEXTROSE 2-4 GM/100ML-% IV SOLN
2.0000 g | INTRAVENOUS | Status: AC
  Administered 2020-05-01: 2 g via INTRAVENOUS
  Filled 2020-05-01: qty 100

## 2020-05-01 MED ORDER — TRANEXAMIC ACID-NACL 1000-0.7 MG/100ML-% IV SOLN
INTRAVENOUS | Status: DC | PRN
  Administered 2020-05-01: 1000 mg via INTRAVENOUS

## 2020-05-01 MED ORDER — ACETAMINOPHEN 325 MG PO TABS
650.0000 mg | ORAL_TABLET | ORAL | Status: DC | PRN
  Administered 2020-05-01 – 2020-05-02 (×3): 650 mg via ORAL
  Filled 2020-05-01 (×3): qty 2

## 2020-05-01 MED ORDER — GABAPENTIN 400 MG PO CAPS
800.0000 mg | ORAL_CAPSULE | Freq: Four times a day (QID) | ORAL | Status: DC | PRN
  Administered 2020-05-01 – 2020-05-02 (×3): 800 mg via ORAL
  Filled 2020-05-01 (×3): qty 2

## 2020-05-01 MED ORDER — MIDAZOLAM HCL 5 MG/5ML IJ SOLN
INTRAMUSCULAR | Status: DC | PRN
  Administered 2020-05-01: 2 mg via INTRAVENOUS

## 2020-05-01 MED ORDER — SODIUM CHLORIDE 0.9% FLUSH: 3.0000 mL | Freq: Two times a day (BID) | INTRAVENOUS | Status: DC

## 2020-05-01 MED ORDER — LISINOPRIL 10 MG PO TABS
10.0000 mg | ORAL_TABLET | Freq: Every day | ORAL | Status: DC
  Administered 2020-05-01 – 2020-05-02 (×2): 10 mg via ORAL
  Filled 2020-05-01 (×2): qty 1

## 2020-05-01 SURGICAL SUPPLY — 74 items
AGENT HMST KT MTR STRL THRMB (HEMOSTASIS) ×2
BLADE CLIPPER SURG (BLADE) IMPLANT
BUR EGG ELITE 4.0 (BURR) IMPLANT
BUR EGG ELITE 4.0MM (BURR)
BUR MATCHSTICK NEURO 3.0 LAGG (BURR) IMPLANT
CABLE BIPOLOR RESECTION CORD (MISCELLANEOUS) ×3 IMPLANT
CANISTER SUCT 3000ML PPV (MISCELLANEOUS) ×3 IMPLANT
CLOSURE STERI-STRIP 1/2X4 (GAUZE/BANDAGES/DRESSINGS) ×1
CLSR STERI-STRIP ANTIMIC 1/2X4 (GAUZE/BANDAGES/DRESSINGS) ×2 IMPLANT
COVER MAYO STAND STRL (DRAPES) ×9 IMPLANT
COVER SURGICAL LIGHT HANDLE (MISCELLANEOUS) ×6 IMPLANT
COVER WAND RF STERILE (DRAPES) IMPLANT
DEVICE ENDSKLTN IMPL 16X14X7X6 (Cage) IMPLANT
DEVICE ENDSKLTN TC NANOLCK 6MM (Cage) IMPLANT
DRAIN CHANNEL 15F RND FF W/TCR (WOUND CARE) ×2 IMPLANT
DRAPE C-ARM 42X72 X-RAY (DRAPES) ×3 IMPLANT
DRAPE POUCH INSTRU U-SHP 10X18 (DRAPES) ×3 IMPLANT
DRAPE SURG 17X23 STRL (DRAPES) ×3 IMPLANT
DRAPE U-SHAPE 47X51 STRL (DRAPES) ×3 IMPLANT
DRSG OPSITE POSTOP 4X6 (GAUZE/BANDAGES/DRESSINGS) ×3 IMPLANT
DRSG TEGADERM 2-3/8X2-3/4 SM (GAUZE/BANDAGES/DRESSINGS) ×3 IMPLANT
DURAPREP 26ML APPLICATOR (WOUND CARE) ×3 IMPLANT
ELECT COATED BLADE 2.86 ST (ELECTRODE) ×3 IMPLANT
ELECT PENCIL ROCKER SW 15FT (MISCELLANEOUS) ×3 IMPLANT
ELECT REM PT RETURN 9FT ADLT (ELECTROSURGICAL) ×3
ELECTRODE REM PT RTRN 9FT ADLT (ELECTROSURGICAL) ×1 IMPLANT
ENDOSKELETON IMPLANT 16X14X7X6 (Cage) ×3 IMPLANT
ENDOSKELETON TC NANOLOCK 6MM (Cage) ×6 IMPLANT
EVACUATOR SILICONE 100CC (DRAIN) ×3 IMPLANT
GLOVE BIO SURGEON STRL SZ 6.5 (GLOVE) ×2 IMPLANT
GLOVE BIO SURGEONS STRL SZ 6.5 (GLOVE) ×1
GLOVE BIOGEL PI IND STRL 8.5 (GLOVE) ×1 IMPLANT
GLOVE BIOGEL PI INDICATOR 8.5 (GLOVE) ×2
GLOVE SS BIOGEL STRL SZ 8.5 (GLOVE) ×1 IMPLANT
GLOVE SUPERSENSE BIOGEL SZ 8.5 (GLOVE) ×2
GLOVE SURG UNDER POLY LF SZ6.5 (GLOVE) ×3 IMPLANT
GOWN STRL REUS W/ TWL LRG LVL3 (GOWN DISPOSABLE) ×2 IMPLANT
GOWN STRL REUS W/TWL 2XL LVL3 (GOWN DISPOSABLE) ×6 IMPLANT
GOWN STRL REUS W/TWL LRG LVL3 (GOWN DISPOSABLE) ×6
KIT BASIN OR (CUSTOM PROCEDURE TRAY) ×3 IMPLANT
KIT TURNOVER KIT B (KITS) ×3 IMPLANT
MIX DBX 10CC 35% BONE (Bone Implant) ×3 IMPLANT
NDL SPNL 18GX3.5 QUINCKE PK (NEEDLE) ×1 IMPLANT
NEEDLE HYPO 22GX1.5 SAFETY (NEEDLE) ×3 IMPLANT
NEEDLE SPNL 18GX3.5 QUINCKE PK (NEEDLE) ×3 IMPLANT
NS IRRIG 1000ML POUR BTL (IV SOLUTION) ×3 IMPLANT
PACK ORTHO CERVICAL (CUSTOM PROCEDURE TRAY) ×3 IMPLANT
PACK UNIVERSAL I (CUSTOM PROCEDURE TRAY) ×3 IMPLANT
PAD ARMBOARD 7.5X6 YLW CONV (MISCELLANEOUS) ×9 IMPLANT
PATTIES SURGICAL .25X.25 (GAUZE/BANDAGES/DRESSINGS) ×3 IMPLANT
PATTIES SURGICAL .5 X.5 (GAUZE/BANDAGES/DRESSINGS) IMPLANT
PIN ACP TEMP FIXATION (EXFIX) ×3 IMPLANT
PLATE ACP 1.9X58 3LVL (Plate) ×3 IMPLANT
POSITIONER HEAD DONUT 9IN (MISCELLANEOUS) ×3 IMPLANT
RESTRAINT LIMB HOLDER UNIV (RESTRAINTS) ×3 IMPLANT
SCREW ACP VA SD 3.5X15 (Screw) ×18 IMPLANT
SCREW FIXED ST 4X15 (Screw) ×6 IMPLANT
SPONGE INTESTINAL PEANUT (DISPOSABLE) ×9 IMPLANT
SPONGE LAP 4X18 RFD (DISPOSABLE) ×6 IMPLANT
SPONGE SURGIFOAM ABS GEL 100 (HEMOSTASIS) ×3 IMPLANT
SURGIFLO W/THROMBIN 8M KIT (HEMOSTASIS) ×4 IMPLANT
SUT BONE WAX W31G (SUTURE) ×3 IMPLANT
SUT MNCRL AB 3-0 PS2 27 (SUTURE) ×3 IMPLANT
SUT SILK 2 0 (SUTURE) ×3
SUT SILK 2-0 18XBRD TIE 12 (SUTURE) ×1 IMPLANT
SUT VIC AB 2-0 CT1 18 (SUTURE) ×3 IMPLANT
SYR BULB IRRIG 60ML STRL (SYRINGE) ×3 IMPLANT
SYR CONTROL 10ML LL (SYRINGE) ×3 IMPLANT
TAPE CLOTH 4X10 WHT NS (GAUZE/BANDAGES/DRESSINGS) ×3 IMPLANT
TAPE UMBILICAL COTTON 1/8X30 (MISCELLANEOUS) ×3 IMPLANT
TOWEL GREEN STERILE (TOWEL DISPOSABLE) ×3 IMPLANT
TOWEL GREEN STERILE FF (TOWEL DISPOSABLE) ×3 IMPLANT
TRAY FOLEY MTR SLVR 16FR STAT (SET/KITS/TRAYS/PACK) ×3 IMPLANT
WATER STERILE IRR 1000ML POUR (IV SOLUTION) ×3 IMPLANT

## 2020-05-01 NOTE — Anesthesia Postprocedure Evaluation (Signed)
Anesthesia Post Note  Patient: Arthur Francis  Procedure(s) Performed: ANTERIOR CERVICAL DECOMPRESSION/DISCECTOMY FUSION CERVICAL FOUR THROUGH  CERVICAL SEVEN (N/A Spine Cervical)     Patient location during evaluation: PACU Anesthesia Type: General Level of consciousness: awake and alert Pain management: pain level controlled Vital Signs Assessment: post-procedure vital signs reviewed and stable Respiratory status: spontaneous breathing, nonlabored ventilation and respiratory function stable Cardiovascular status: blood pressure returned to baseline and stable Postop Assessment: no apparent nausea or vomiting Anesthetic complications: no   No complications documented.  Last Vitals:  Vitals:   05/01/20 1920 05/01/20 1945  BP: (!) 145/83 (!) 163/80  Pulse: 80 73  Resp: 15 18  Temp: 36.8 C 36.8 C  SpO2: 97% 99%    Last Pain:  Vitals:   05/01/20 1945  TempSrc: Oral  PainSc:                  Audry Pili

## 2020-05-01 NOTE — Brief Op Note (Signed)
05/01/2020  6:24 PM  PATIENT:  [name]  65 y.o. male  PRE-OPERATIVE DIAGNOSIS:  Cervical spondyloitic radiculopathy  POST-OPERATIVE DIAGNOSIS:  Cervical spondyloitic radiculopathy  PROCEDURE:  Procedure(s) with comments: ANTERIOR CERVICAL DECOMPRESSION/DISCECTOMY FUSION CERVICAL FOUR THROUGH  CERVICAL SEVEN (N/A) - 4.5 hrs  SURGEON:  Surgeon(s) and Role:    Melina Schools, MD - Primary  PHYSICIAN ASSISTANT: Cleta Alberts, PA  ASSISTANTS: Estill Bamberg Ward, PA  ANESTHESIA:   general  EBL:  50 mL   BLOOD ADMINISTERED:none  DRAINS: 1 JP drain   LOCAL MEDICATIONS USED:  MARCAINE     SPECIMEN:  No Specimen  DISPOSITION OF SPECIMEN:  N/A  COUNTS:  YES  TOURNIQUET:  * No tourniquets in log *  DICTATION: .Dragon Dictation  PLAN OF CARE: Admit for overnight observation  PATIENT DISPOSITION:  PACU - hemodynamically stable.

## 2020-05-01 NOTE — Transfer of Care (Signed)
Immediate Anesthesia Transfer of Care Note  Patient: Arthur Francis  Procedure(s) Performed: ANTERIOR CERVICAL DECOMPRESSION/DISCECTOMY FUSION CERVICAL FOUR THROUGH  CERVICAL SEVEN (N/A Spine Cervical)  Patient Location: PACU  Anesthesia Type:General  Level of Consciousness: awake, alert  and oriented  Airway & Oxygen Therapy: Patient Spontanous Breathing  Post-op Assessment: Report given to RN and Patient moving all extremities X 4  Post vital signs: Reviewed and stable  Last Vitals:  Vitals Value Taken Time  BP 134/89 05/01/20 1835  Temp    Pulse 85 05/01/20 1835  Resp 20 05/01/20 1835  SpO2 97 % 05/01/20 1835  Vitals shown include unvalidated device data.  Last Pain:  Vitals:   05/01/20 1206  TempSrc:   PainSc: 7       Patients Stated Pain Goal: 4 (72/27/73 7505)  Complications: No complications documented.

## 2020-05-01 NOTE — Anesthesia Procedure Notes (Signed)
Procedure Name: Intubation Date/Time: 05/01/2020 2:15 PM Performed by: Hoy Morn, CRNA Pre-anesthesia Checklist: Patient identified, Emergency Drugs available, Suction available and Patient being monitored Patient Re-evaluated:Patient Re-evaluated prior to induction Oxygen Delivery Method: Circle system utilized Preoxygenation: Pre-oxygenation with 100% oxygen Induction Type: IV induction Ventilation: Mask ventilation without difficulty Laryngoscope Size: Glidescope and 4 Grade View: Grade I Tube type: Oral Tube size: 7.5 mm Number of attempts: 1 Airway Equipment and Method: Stylet and Oral airway Placement Confirmation: ETT inserted through vocal cords under direct vision,  positive ETCO2 and breath sounds checked- equal and bilateral Secured at: 24 cm Tube secured with: Tape Dental Injury: Teeth and Oropharynx as per pre-operative assessment

## 2020-05-01 NOTE — Op Note (Signed)
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  6 mm medium lordotic cage at C4-5 and C5-6.  7 mm medium lordotic cage at C6-7. NuVasive ACP cervical plate.  58 mm length.  15 mm locking screws.  Allograft: DBX mix  BRIEF HISTORY: Arthur MICHIELSis a 65y.o. male who has had significant neck and neuropathic arm pain for some time now.  Attempts at conservative management had failed to alleviate his symptoms.  As result of worsening pain elected to move forward with surgery.  All appropriate risks and effects and alternatives of surgery were discussed with the patient and consent was obtained  PROCEDURE DETAILS: Patient was brought into the operating room. After successful induction of general anesthesia and tracheal intubation a Time Out was done. This confirmed all pertinent important data.  The anterior cervical spine was then prepped and draped in a standard fashion.  Using lateral fluoroscopy identified the C4 and C7 vertebral bodies.  I then marked out a longitudinal incision which was then infiltrated with quarter percent Marcaine with epinephrine.  A standard Smith-Robinson approach via longitudinal incision was made.  Incision was made along the medial border the sternocleidomastoid and I sharply dissected down to and through the platysma.  I then began sharply dissecting along the medial border of the sternocleidomastoid until I identified and isolated the omohyoid.  In order for better visualization the omohyoid was sacrificed.  I continued to dissect till is able to palpate the carotid  sheath.  I then swept the esophagus and trachea to the right and placed a hand-held retractor I then used my Kitner dissectors to remove the remaining vertebral fascia to expose the anterior longitudinal ligament.  I made sure to mobilize the prevertebral fascia from the midportion of C4 to the midportion of C7.  I placed the needle into the C4 5 disc space and took an x-ray to confirm that I was at the appropriate level.  Once I confirmed the C4-5 level marked the disc space with a Bovie.  I then marked the C5-6, and C6-7 levels.  I then began mobilizing the longus coli muscle from the mid body of C4 to the mid body of C7.  I used bipolar cautery to mobilize this.  There were large anterior exostosis which I took down with the Leksell rongeur.  At this point time with the anterior cervical spine exposed I placed my self-retaining retractor underneath the longus coli muscle and deflated the endotracheal cuff.  I expanded this retractor to the appropriate width to fully expose the C6-7 disc space.  I reinflated the endotracheal cuff.  An annulotomy was performed with a 15 blade scalpel and then I used pituitary rongeurs and curettes to remove all of the disc material.  The osteophyte from the inferior aspect of the C5-6 vertebral body was isolated and resected with a 2 mm Kerrison rongeur.  I continued to dissect and remove the disc as well as cartilaginous endplate to expose the bleeding subchondral bone.  Distraction pins were then placed into the body of C6 and C7 and I distracted the intervertebral space with a lamina spreader.  I maintain this exposure with the distraction pin set.  I continue to work posteriorly until I could visualize the posterior aspect of the vertebral bodies.  I then used a 1 mm Kerrison rongeur to take down the posterior osteophytes from the C6 and C7 vertebral body.  I then used my fine nerve hook to dissect through the posterior longitudinal ligament.  And then resected the posterior  longitudinal ligament with a 1 mm Kerrison rongeur.  I was able to undercut under the uncovertebral joint to adequately decompress the foramen.  The osteophyte that was seen on the preoperative imaging studies was resected.  At this point I was very pleased with the overall discectomy.  I then used the trial rasps intervertebral spacers and elected to use the size 7 medium spacer.  After I remove the rasp I irrigated the wound copiously normal saline and check with a nerve hook to ensure I had an adequate decompression.  I was able to pass my nerve hook under the vertebral body and out under the uncovertebral joint.  The implant was obtained and packed with the allograft and malleted to the appropriate depth.  I confirmed satisfactory position in the lateral plane.  At this point time I repositioned my distraction pin from C7 to C5 and reposition the retracting blades.  I now have visualization of the C5-6 disc space.  Using the same exact technique I used at C6-7 I performed a discectomy at C5-6.  I again made sure to set the posterior osteophyte with a 1 mm Kerrison and dissect through the posterior osteophyte.  I again used a fine nerve hook to dissect through the PLL and then I resected it with a 1 mm Kerrison rongeur.  I was able to freely pass my nerve hook underneath the uncovertebral joint and I confirmed satisfactory decompression with fluoroscopy I again obtained the trial implants and elected to use the size 6 deemed cage.  This provided excellent overall distraction of the intervertebral space and obtain the indirect foraminal decompression.  The implant was obtained and packed with allograft and malleted into position.  Once I confirmed satisfactory position with fluoroscopy I then reposition my distraction pin from C6 to C4, and reposition the retractor blades to expose the C4-5 level.  At this point with the C4-5 level exposed I performed a discectomy in the same fashion that I performed at the  other 2 levels.  I again took down the posterior longitudinal ligament as well as the posterior osteophytes in the same fashion.  The hard disc osteophyte/disc protrusions were removed.  I was able to adequately decompress the uncovertebral joint to confirm satisfactory indirect foraminal decompression.  At this point I was pleased with the overall decompression.  I rasped the endplates to remove any remaining cartilaginous material and expose the bleeding subchondral bone.  I placed my rasp trials and elected use the size 6 medium implant.  The implant was obtained packed with bone graft and malleted into position.  At this point I removed all of the retracting pins and irrigated the wound copiously normal saline.  Hemostasis was obtained using bipolar cautery and FloSeal.  Anterior cervical plate was painted contoured and secured to the vertebral body.  I used 15 mm locking screws that were self drilling.  All screws had excellent purchase.  I made sure that the trachea and esophagus were not entrapped beneath the plate.  Once all screws were placed and proper position was confirmed I then locked the  screws in place to the plate to prevent backout.  This was done according manufacture standards.  I then irrigated the wound copiously with normal saline.  I did place a deep drain which was brought out through a separate stab incision.  I then returned the trachea esophagus to midline.  The platysma was closed with interrupted 2-0 Vicryl suture.  The skin was reapproximated with 3-0 Monocryl.  Steri-Strips and a dry dressing were applied.  Patient was ultimately extubated transfer the PACU without incident.  The end of the case all needle sponge counts were correct.  There were no adverse intraoperative events. Melina Schools, MD 05/01/2020 6:09 PM

## 2020-05-01 NOTE — Anesthesia Preprocedure Evaluation (Signed)
Anesthesia Evaluation  Patient identified by MRN, date of birth, ID band Patient awake    Reviewed: Allergy & Precautions, NPO status , Patient's Chart, lab work & pertinent test results, reviewed documented beta blocker date and time   History of Anesthesia Complications Negative for: history of anesthetic complications  Airway Mallampati: II  TM Distance: >3 FB Neck ROM: Limited    Dental  (+) Teeth Intact, Dental Advisory Given   Pulmonary Current Smoker and Patient abstained from smoking.,    Pulmonary exam normal breath sounds clear to auscultation       Cardiovascular hypertension, Pt. on home beta blockers and Pt. on medications (-) angina+ CAD, + Past MI and + Cardiac Stents  Normal cardiovascular exam Rhythm:Regular Rate:Normal     Neuro/Psych PSYCHIATRIC DISORDERS Anxiety Depression Cervical spondyloitic radiculopathy    GI/Hepatic hiatal hernia, GERD  Medicated and Controlled,(+) Hepatitis - (s/p Harvoni), C  Endo/Other  negative endocrine ROS  Renal/GU negative Renal ROS     Musculoskeletal negative musculoskeletal ROS (+)   Abdominal   Peds  Hematology negative hematology ROS (+)   Anesthesia Other Findings Day of surgery medications reviewed with the patient.  Reproductive/Obstetrics                            Anesthesia Physical Anesthesia Plan  ASA: III  Anesthesia Plan: General   Post-op Pain Management:    Induction: Intravenous  PONV Risk Score and Plan: 2 and Midazolam, Dexamethasone and Ondansetron  Airway Management Planned: Oral ETT and Video Laryngoscope Planned  Additional Equipment: Arterial line  Intra-op Plan:   Post-operative Plan: Extubation in OR  Informed Consent: I have reviewed the patients History and Physical, chart, labs and discussed the procedure including the risks, benefits and alternatives for the proposed anesthesia with the patient  or authorized representative who has indicated his/her understanding and acceptance.     Dental advisory given  Plan Discussed with: CRNA  Anesthesia Plan Comments: (2nd PIV after induction.)        Anesthesia Quick Evaluation

## 2020-05-01 NOTE — Discharge Instructions (Signed)
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
## 2020-05-02 DIAGNOSIS — Z8619 Personal history of other infectious and parasitic diseases: Secondary | ICD-10-CM | POA: Diagnosis not present

## 2020-05-02 DIAGNOSIS — I252 Old myocardial infarction: Secondary | ICD-10-CM | POA: Diagnosis not present

## 2020-05-02 DIAGNOSIS — M2578 Osteophyte, vertebrae: Secondary | ICD-10-CM | POA: Diagnosis present

## 2020-05-02 DIAGNOSIS — M50123 Cervical disc disorder at C6-C7 level with radiculopathy: Secondary | ICD-10-CM | POA: Diagnosis present

## 2020-05-02 DIAGNOSIS — I2510 Atherosclerotic heart disease of native coronary artery without angina pectoris: Secondary | ICD-10-CM | POA: Diagnosis present

## 2020-05-02 DIAGNOSIS — Z7982 Long term (current) use of aspirin: Secondary | ICD-10-CM | POA: Diagnosis not present

## 2020-05-02 DIAGNOSIS — G8929 Other chronic pain: Secondary | ICD-10-CM | POA: Diagnosis present

## 2020-05-02 DIAGNOSIS — Z885 Allergy status to narcotic agent status: Secondary | ICD-10-CM | POA: Diagnosis not present

## 2020-05-02 DIAGNOSIS — Z87891 Personal history of nicotine dependence: Secondary | ICD-10-CM | POA: Diagnosis not present

## 2020-05-02 DIAGNOSIS — Z955 Presence of coronary angioplasty implant and graft: Secondary | ICD-10-CM | POA: Diagnosis not present

## 2020-05-02 DIAGNOSIS — M4802 Spinal stenosis, cervical region: Secondary | ICD-10-CM | POA: Diagnosis present

## 2020-05-02 DIAGNOSIS — Z79899 Other long term (current) drug therapy: Secondary | ICD-10-CM | POA: Diagnosis not present

## 2020-05-02 DIAGNOSIS — Z888 Allergy status to other drugs, medicaments and biological substances status: Secondary | ICD-10-CM | POA: Diagnosis not present

## 2020-05-02 DIAGNOSIS — Z20822 Contact with and (suspected) exposure to covid-19: Secondary | ICD-10-CM | POA: Diagnosis present

## 2020-05-02 MED FILL — Thrombin (Recombinant) For Soln 20000 Unit: CUTANEOUS | Qty: 1 | Status: AC

## 2020-05-02 NOTE — Evaluation (Signed)
Physical Therapy Evaluation & Discharge Patient Details Name: Arthur Francis MRN: [id number] DOB: 1955/05/07 Today's Date: 05/02/2020   History of Present Illness  65 y/o male s/p ACDF C4-7 on 3/9 after attempts of conservative management of neck and radicular arm pain. PMH: CAD, GERD, Hep C, HLD, HTN, Liver fibrosis, MI  Clinical Impression  PTA, patient lives with wife and reports independence with use of RW and cane, alternating depending on day and pain level. Patient overall modI for mobility with no AD. Educated patient on cervical precautions with good follow through and carryover to mobility. No further skilled PT needs required acutely. No PT follow up recommended at this time.     Follow Up Recommendations No PT follow up    Equipment Recommendations  None recommended by PT    Recommendations for Other Services       Precautions / Restrictions Precautions Precautions: Cervical Precaution Booklet Issued: Yes (comment) Required Braces or Orthoses: Cervical Brace Cervical Brace: Hard collar;At all times Restrictions Weight Bearing Restrictions: No      Mobility  Bed Mobility Overal bed mobility: Modified Independent             General bed mobility comments: standing in room independently on arrival    Transfers Overall transfer level: Modified independent Equipment used: None                Ambulation/Gait Ambulation/Gait assistance: Modified independent (Device/Increase time) Gait Distance (Feet): 300 Feet Assistive device: None Gait Pattern/deviations: WFL(Within Functional Limits);Drifts right/left     General Gait Details: Ambulates with R foot externally rotated and demos balance deficits with multitasking  Stairs Stairs: Yes Stairs assistance: Modified independent (Device/Increase time) Stair Management: No rails;Step to pattern;Forwards Number of Stairs: 3    Wheelchair Mobility    Modified Rankin (Stroke Patients Only)        Balance Overall balance assessment: Mild deficits observed, not formally tested                                           Pertinent Vitals/Pain Pain Assessment: Faces Faces Pain Scale: Hurts a little bit Pain Location: neck Pain Descriptors / Indicators: Discomfort Pain Intervention(s): Monitored during session    Home Living Family/patient expects to be discharged to:: Private residence Living Arrangements: Spouse/significant other Available Help at Discharge: Family;Available 24 hours/day Type of Home: House Home Access: Stairs to enter Entrance Stairs-Rails: None Entrance Stairs-Number of Steps: 2 Home Layout: One level Home Equipment: Arthur Francis - 2 wheels;Cane - single point;Grab bars - tub/shower      Prior Function Level of Independence: Independent with assistive device(s)         Comments: Patient states he uses RW a lot and SPC at times when "sciatic nerve begins to act up"     Hand Dominance        Extremity/Trunk Assessment   Upper Extremity Assessment Upper Extremity Assessment: Defer to OT evaluation    Lower Extremity Assessment Lower Extremity Assessment: Overall WFL for tasks assessed    Cervical / Trunk Assessment Cervical / Trunk Assessment: Normal  Communication   Communication: No difficulties  Cognition Arousal/Alertness: Awake/alert Behavior During Therapy: WFL for tasks assessed/performed Overall Cognitive Status: Within Functional Limits for tasks assessed  General Comments      Exercises     Assessment/Plan    PT Assessment Patent does not need any further PT services  PT Problem List         PT Treatment Interventions      PT Goals (Current goals can be found in the Care Plan section)  Acute Rehab PT Goals Patient Stated Goal: to go home before lunch PT Goal Formulation: With patient    Frequency     Barriers to discharge         Co-evaluation               AM-PAC PT "6 Clicks" Mobility  Outcome Measure Help needed turning from your back to your side while in a flat bed without using bedrails?: None Help needed moving from lying on your back to sitting on the side of a flat bed without using bedrails?: None Help needed moving to and from a bed to a chair (including a wheelchair)?: None Help needed standing up from a chair using your arms (e.g., wheelchair or bedside chair)?: None Help needed to walk in hospital room?: None Help needed climbing 3-5 steps with a railing? : None 6 Click Score: 24    End of Session Equipment Utilized During Treatment: Cervical collar Activity Tolerance: Patient tolerated treatment well Patient left: in bed;with call bell/phone within reach Nurse Communication: Mobility status PT Visit Diagnosis: Unsteadiness on feet (R26.81)    Time: 9355-2174 PT Time Calculation (min) (ACUTE ONLY): 17 min   Charges:   PT Evaluation $PT Eval Low Complexity: 1 Low          [name] A. Gilford Rile PT, DPT Acute Rehabilitation Services Pager [phone] Office [phone]   Linna Hoff 05/02/2020, 9:09 AM

## 2020-05-02 NOTE — Progress Notes (Signed)
Patient is discharged from room 3C03 at this time. Alert and in stable condition. IV site d/c'd and JP drain removed. Instructions read to patient and spouse with understanding verbalized and all questions answered. Left unit via wheelchair with all belongings at side.

## 2020-05-02 NOTE — Evaluation (Signed)
Occupational Therapy Evaluation Patient Details Name: Arthur Francis MRN: [id number] DOB: [birth year] Today's Date: 05/02/2020    History of Present Illness 65 y/o male s/p ACDF C4-7 on 3/9 after attempts of conservative management of neck and radicular arm pain. PMH: CAD, GERD, Hep C, HLD, HTN, Liver fibrosis, MI   Clinical Impression   Patient evaluated by Occupational Therapy with no further acute OT needs identified. All education has been completed and the patient has no further questions. Pt is able to perform ADLs at supervision level and demonstrates good understanding of precautions.   See below for any follow-up Occupational Therapy or equipment needs. OT is signing off. Thank you for this referral.        Follow Up Recommendations  No OT follow up;Supervision - Intermittent    Equipment Recommendations  None recommended by OT    Recommendations for Other Services       Precautions / Restrictions Precautions Precautions: Cervical Precaution Booklet Issued: Yes (comment) Required Braces or Orthoses: Cervical Brace Cervical Brace: Hard collar;At all times      Mobility Bed Mobility Overal bed mobility: Modified Independent                  Transfers Overall transfer level: Modified independent Equipment used: None                  Balance Overall balance assessment: Mild deficits observed, not formally tested                                         ADL either performed or assessed with clinical judgement   ADL Overall ADL's : Needs assistance/impaired Eating/Feeding: Independent   Grooming: Wash/dry hands;Wash/dry face;Oral care;Brushing hair;Supervision/safety;Standing Grooming Details (indicate cue type and reason): reviewed safe technique Upper Body Bathing: Set up;Supervision/ safety;Sitting   Lower Body Bathing: Supervison/ safety;Sit to/from stand   Upper Body Dressing : Supervision/safety;Sitting   Lower Body  Dressing: Supervision/safety;Sit to/from stand Lower Body Dressing Details (indicate cue type and reason): able to perform figure 4 Toilet Transfer: Supervision/safety;Ambulation;Comfort height toilet   Toileting- Clothing Manipulation and Hygiene: Supervision/safety;Sit to/from stand   Tub/ Shower Transfer: English as a second language teacher Details (indicate cue type and reason): simulated tub transfer Functional mobility during ADLs: Supervision/safety General ADL Comments: reviewed safety with ADLs.  Pt demonstrates good awareness of precautions     Vision Baseline Vision/History: Wears glasses Wears Glasses: At all times Patient Visual Report: No change from baseline       Perception     Praxis      Pertinent Vitals/Pain Pain Assessment: 0-10 Faces Pain Scale: Hurts whole lot Pain Location: neck Pain Descriptors / Indicators: Discomfort;Operative site guarding Pain Intervention(s): Patient requesting pain meds-RN notified;Monitored during session     Hand Dominance     Extremity/Trunk Assessment Upper Extremity Assessment Upper Extremity Assessment: Overall WFL for tasks assessed   Lower Extremity Assessment Lower Extremity Assessment: Defer to PT evaluation   Cervical / Trunk Assessment Cervical / Trunk Assessment: Normal   Communication Communication Communication: No difficulties   Cognition Arousal/Alertness: Awake/alert Behavior During Therapy: WFL for tasks assessed/performed Overall Cognitive Status: Within Functional Limits for tasks assessed                                     General Comments  Pt eager to discharge home    Exercises     Shoulder Instructions      Home Living Family/patient expects to be discharged to:: Private residence Living Arrangements: Spouse/significant other Available Help at Discharge: Family;Available 24 hours/day Type of Home: House Home Access: Stairs to enter [employer] of Steps:  2 Entrance Stairs-Rails: None Home Layout: One level     Bathroom Shower/Tub: Teacher, early years/pre: Handicapped height     Home Equipment: Environmental consultant - 2 wheels;Cane - single point;Grab bars - tub/shower   Additional Comments: Pt reports he lives with his s/o who is availalb e      Prior Functioning/Environment Level of Independence: Independent with assistive device(s)        Comments: Patient states he uses RW a lot and SPC at times when "sciatic nerve begins to act up"        OT Problem List: Decreased knowledge of precautions;Pain      OT Treatment/Interventions:      OT Goals(Current goals can be found in the care plan section) Acute Rehab OT Goals Patient Stated Goal: to go home today OT Goal Formulation: All assessment and education complete, DC therapy  OT Frequency:     Barriers to D/C:            Co-evaluation              AM-PAC OT "6 Clicks" Daily Activity     Outcome Measure Help from another person eating meals?: None Help from another person taking care of personal grooming?: A Little Help from another person toileting, which includes using toliet, bedpan, or urinal?: A Little Help from another person bathing (including washing, rinsing, drying)?: A Little Help from another person to put on and taking off regular upper body clothing?: A Little Help from another person to put on and taking off regular lower body clothing?: A Little 6 Click Score: 19   End of Session Nurse Communication: Mobility status;Patient requests pain meds  Activity Tolerance: Patient tolerated treatment well Patient left: in bed;with call bell/phone within reach;with family/visitor present  OT Visit Diagnosis: Pain Pain - part of body:  (neck)                Time: 2035-5974 OT Time Calculation (min): 15 min Charges:  OT General Charges $OT Visit: 1 Visit OT Evaluation $OT Eval Moderate Complexity: 1 Mod  Nilsa Nutting., OTR/L Acute Rehabilitation  Services Pager [phone] Office [phone]   Lucille Passy M 05/02/2020, 1:22 PM

## 2020-05-02 NOTE — Progress Notes (Signed)
Subjective: 1 Day Post-Op Procedure(s) (LRB): ANTERIOR CERVICAL DECOMPRESSION/DISCECTOMY FUSION CERVICAL FOUR THROUGH  CERVICAL SEVEN (N/A) Patient reports pain as mild.   Radicular arm pain improved. Tolerating PO without nausea, vomitting, or significant swallowing difficulty.  +void, + flatus +ambulation Denies CP, calf pain, SOB  Objective: Vital signs in last 24 hours: Temp:  [97.8 F (36.6 C)-98.9 F (37.2 C)] 98.9 F (37.2 C) (03/10 0714) Pulse Rate:  [73-93] 75 (03/10 0714) Resp:  [10-20] 16 (03/10 0714) BP: (134-163)/(72-91) 147/86 (03/10 0714) SpO2:  [96 %-100 %] 99 % (03/10 0714) Weight:  [84.8 kg] 84.8 kg (03/09 1058)  Intake/Output from previous day: 03/09 0701 - 03/10 0700 In: 2100 [I.V.:2000; IV Piggyback:100] Out: 560 [Urine:450; Drains:60; Blood:50] Intake/Output this shift: No intake/output data recorded.  Recent Labs    04/29/20 1336  HGB 14.9   Recent Labs    04/29/20 1336  WBC 8.2  RBC 4.54  HCT 45.3  PLT 230   Recent Labs    04/29/20 1336  NA 139  K 4.2  CL 107  CO2 23  BUN 12  CREATININE 1.11  GLUCOSE 90  CALCIUM 9.5   Recent Labs    04/29/20 1336  INR 1.0    Neurologically intact ABD soft Neurovascular intact Sensation intact distally Intact pulses distally Dorsiflexion/Plantar flexion intact Incision: dressing C/D/I No cellulitis present Compartment soft 50 mL of serousaginous fluid from 7pm to 7 am   Assessment/Plan: 1 Day Post-Op Procedure(s) (LRB): ANTERIOR CERVICAL DECOMPRESSION/DISCECTOMY FUSION CERVICAL FOUR THROUGH  CERVICAL SEVEN (N/A) Advance diet Up with therapy We will reassess drain out-put later today after patient has worked with PT. Assuming drain out-put has slowed we will likely pull drain and D/C him later today. Out patient post-op meds per patient's pain management provider.  DVT PPx: Teds, SCDs, ambulation. Will restart ASA 81 mg tomorrow.   F/U 2 weeks in office with Dr. Budd Palmer   N Stephana Morell 05/02/2020, 7:42 AM

## 2020-05-03 ENCOUNTER — Encounter (HOSPITAL_COMMUNITY): Payer: Self-pay | Admitting: Orthopedic Surgery
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
## 2020-06-20 ENCOUNTER — Other Ambulatory Visit: Payer: Self-pay | Admitting: Nurse Practitioner

## 2020-06-20 DIAGNOSIS — K7469 Other cirrhosis of liver: Secondary | ICD-10-CM

## 2020-07-11 ENCOUNTER — Other Ambulatory Visit: Payer: 59

## 2020-08-08 ENCOUNTER — Ambulatory Visit
Admission: RE | Admit: 2020-08-08 | Discharge: 2020-08-08 | Disposition: A | Discharge status: Home / Self Care | Payer: Medicare Other | Source: Ambulatory Visit | Attending: Nurse Practitioner | Admitting: Nurse Practitioner

## 2020-08-08 DIAGNOSIS — K7469 Other cirrhosis of liver: Secondary | ICD-10-CM

## 2020-10-29 ENCOUNTER — Ambulatory Visit: Payer: Medicare Other | Admitting: Physician Assistant
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
## 2021-03-05 ENCOUNTER — Ambulatory Visit: Payer: Medicaid Other | Admitting: Physician Assistant

## 2021-04-23 ENCOUNTER — Other Ambulatory Visit: Payer: Self-pay | Admitting: Cardiovascular Disease

## 2021-04-28 ENCOUNTER — Ambulatory Visit: Payer: Medicaid Other | Admitting: Cardiology
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
## 2021-05-14 ENCOUNTER — Encounter: Payer: Self-pay | Admitting: Physician Assistant

## 2021-05-14 ENCOUNTER — Other Ambulatory Visit: Payer: Self-pay

## 2021-05-14 ENCOUNTER — Ambulatory Visit (INDEPENDENT_AMBULATORY_CARE_PROVIDER_SITE_OTHER): Payer: Medicare Other | Admitting: Physician Assistant

## 2021-05-14 VITALS — BP 110/62 | HR 56 | Ht 73.0 in | Wt 187.2 lb

## 2021-05-14 DIAGNOSIS — K7469 Other cirrhosis of liver: Secondary | ICD-10-CM

## 2021-05-14 DIAGNOSIS — I2510 Atherosclerotic heart disease of native coronary artery without angina pectoris: Secondary | ICD-10-CM

## 2021-05-14 DIAGNOSIS — E785 Hyperlipidemia, unspecified: Secondary | ICD-10-CM

## 2021-05-14 DIAGNOSIS — F17200 Nicotine dependence, unspecified, uncomplicated: Secondary | ICD-10-CM

## 2021-05-14 DIAGNOSIS — K7460 Unspecified cirrhosis of liver: Secondary | ICD-10-CM | POA: Insufficient documentation

## 2021-05-14 DIAGNOSIS — I25118 Atherosclerotic heart disease of native coronary artery with other forms of angina pectoris: Secondary | ICD-10-CM | POA: Diagnosis not present

## 2021-05-14 DIAGNOSIS — I10 Essential (primary) hypertension: Secondary | ICD-10-CM | POA: Diagnosis not present

## 2021-05-14 LAB — COMPREHENSIVE METABOLIC PANEL
ALT: 14 IU/L (ref 0–44)
AST: 19 IU/L (ref 0–40)
Albumin/Globulin Ratio: 1.9 (ref 1.2–2.2)
Albumin: 4.6 g/dL (ref 3.8–4.8)
Alkaline Phosphatase: 59 IU/L (ref 44–121)
BUN/Creatinine Ratio: 9 — ABNORMAL LOW (ref 10–24)
BUN: 11 mg/dL (ref 8–27)
Bilirubin Total: 0.4 mg/dL (ref 0.0–1.2)
CO2: 27 mmol/L (ref 20–29)
Calcium: 9.7 mg/dL (ref 8.6–10.2)
Chloride: 100 mmol/L (ref 96–106)
Creatinine, Ser: 1.16 mg/dL (ref 0.76–1.27)
Globulin, Total: 2.4 g/dL (ref 1.5–4.5)
Glucose: 88 mg/dL (ref 70–99)
Potassium: 4.8 mmol/L (ref 3.5–5.2)
Sodium: 138 mmol/L (ref 134–144)
Total Protein: 7 g/dL (ref 6.0–8.5)
eGFR: 70 mL/min/{1.73_m2} (ref >59)

## 2021-05-14 MED ORDER — ASPIRIN EC 81 MG PO TBEC: 81.0000 mg | DELAYED_RELEASE_TABLET | Freq: Every day | ORAL | 3 refills | Status: AC

## 2021-05-14 NOTE — Assessment & Plan Note (Signed)
Blood pressure is controlled.  Continue lisinopril 10 mg daily, metoprolol succinate 25 mg daily. ?

## 2021-05-14 NOTE — Assessment & Plan Note (Signed)
Continue rosuvastatin 40 mg daily.  Obtain CMET, fasting lipids today. ?

## 2021-05-14 NOTE — Assessment & Plan Note (Signed)
He is followed by Dr. Beverley Fiedler at [facility]. ?

## 2021-05-14 NOTE — Assessment & Plan Note (Signed)
History of non-STEMI in 2060 with a bare-metal stent to the proximal LAD.  Cardiac catheterization in 2010 demonstrated patent stent.  His last Myoview in 2017 was low risk.  He does note 1 episode of chest discomfort consistent with angina recently.  He took nitroglycerin.  He continues to smoke.  His electrocardiogram is normal.  As it has been 6 years since his last assessment for ischemia, I recommended proceeding with stress testing. ?? Arrange Lexiscan Myoview ?? Continue aspirin 81 mg daily, rosuvastatin 40 mg daily, metoprolol succinate 25 mg daily ?? Follow-up 1 year ?

## 2021-05-14 NOTE — Patient Instructions (Addendum)
Medication Instructions:  ?No changes.  See your medication list. ?*If you need a refill on your cardiac medications before your next appointment, please call your pharmacy* ? ? ?Lab Work: ?Today - CMET, Fasting Lipids ?If you have labs (blood work) drawn today and your tests are completely normal, you will receive your results only by: ?MyChart Message (if you have MyChart) OR ?A paper copy in the mail ?If you have any lab test that is abnormal or we need to change your treatment, we will call you to review the results. ? ? ?Testing/Procedures: ?Schedule Nuclear Stress Test (Lexiscan Myoview) ?Your physician has requested that you have a lexiscan myoview. For further information please visit [email or web address]. Please follow instruction sheet, BELOW: ? ? ? ?You are scheduled for a Myocardial Perfusion Imaging Study  ?Please arrive 15 minutes prior to your appointment time for registration and insurance purposes. ? ?The test will take approximately 3 to 4 hours to complete; you may bring reading material.  If someone comes with you to your appointment, they will need to remain in the main lobby due to limited space in the testing area. **If you are pregnant or breastfeeding, please notify the nuclear lab prior to your appointment** ? ?How to prepare for your Myocardial Perfusion Test: ?Do not eat or drink 3 hours prior to your test, except you may have water. ?Do not consume products containing caffeine (regular or decaffeinated) 12 hours prior to your test. (ex: coffee, chocolate, sodas, tea). ?Do bring a list of your current medications with you.  If not listed below, you may take your medications as normal. ?Do wear comfortable clothes (no dresses or overalls) and walking shoes, tennis shoes preferred (No heels or open toe shoes are allowed). ?Do NOT wear cologne, perfume, aftershave, or lotions (deodorant is allowed). ?If these instructions are not followed, your test will have to be  rescheduled. ? ? ? ? ?Follow-Up: ?At [facility], you and your health needs are our priority.  As part of our continuing mission to provide you with exceptional heart care, we have created designated Provider Care Teams.  These Care Teams include your primary Cardiologist (physician) and Advanced Practice Providers (APPs -  Physician Assistants and Nurse Practitioners) who all work together to provide you with the care you need, when you need it. ? ?We recommend signing up for the patient portal called "MyChart".  Sign up information is provided on this After Visit Summary.  MyChart is used to connect with patients for Virtual Visits (Telemedicine).  Patients are able to view lab/test results, encounter notes, upcoming appointments, etc.  Non-urgent messages can be sent to your provider as well.   ?To learn more about what you can do with MyChart, go to [email or web address].   ? ?Your next appointment:   ?12 month(s) ? ?The format for your next appointment:   ?In Person ? ?Provider:   ?Richardson Dopp, PA-C       ? ? ?Other Instructions ?  ?

## 2021-05-14 NOTE — Assessment & Plan Note (Signed)
We again discussed the importance of quitting. ?

## 2021-05-20 LAB — SPECIMEN STATUS REPORT

## 2021-05-20 LAB — LIPID PANEL

## 2021-05-20 NOTE — Progress Notes (Signed)
Pt has been made aware of normal result and verbalized understanding.  jw  ?Pt will have Lipids done when he comes for his stress test 05/28/21.

## 2021-05-21 ENCOUNTER — Telehealth (HOSPITAL_COMMUNITY): Payer: Self-pay | Admitting: *Deleted

## 2021-05-21 NOTE — Telephone Encounter (Signed)
Patient given detailed instructions per Myocardial Perfusion Study Information Sheet for the test on 05/28/21 Patient notified to arrive 15 minutes early and that it is imperative to arrive on time for appointment to keep from having the test rescheduled. ? If you need to cancel or reschedule your appointment, please call the office within 24 hours of your appointment. . Patient verbalized understanding.Kirstie Peri ? ? ?

## 2021-05-22 ENCOUNTER — Other Ambulatory Visit: Payer: Self-pay | Admitting: Cardiovascular Disease

## 2021-05-28 ENCOUNTER — Other Ambulatory Visit: Payer: Medicare Other

## 2021-05-28 ENCOUNTER — Encounter (HOSPITAL_COMMUNITY): Payer: Medicare Other

## 2021-06-25 ENCOUNTER — Encounter (HOSPITAL_COMMUNITY): Payer: Self-pay | Admitting: *Deleted

## 2021-06-25 ENCOUNTER — Telehealth (HOSPITAL_COMMUNITY): Payer: Self-pay | Admitting: *Deleted

## 2021-06-25 NOTE — Telephone Encounter (Signed)
Left message on voicemail in reference to upcoming appointment scheduled for 07/02/21. Phone number given for a call back so details instructions can be given. Crespin Forstrom, Ranae Palms ?Mychart letter sent ? ?

## 2021-07-02 ENCOUNTER — Encounter (HOSPITAL_COMMUNITY): Payer: Medicare Other

## 2021-07-02 ENCOUNTER — Other Ambulatory Visit: Payer: Medicare Other

## 2021-07-16 ENCOUNTER — Other Ambulatory Visit: Payer: Medicare Other

## 2021-07-18 ENCOUNTER — Encounter (HOSPITAL_COMMUNITY): Payer: Self-pay | Admitting: *Deleted

## 2021-07-23 ENCOUNTER — Encounter (HOSPITAL_COMMUNITY): Payer: Medicare Other

## 2021-07-25 ENCOUNTER — Encounter (HOSPITAL_COMMUNITY): Payer: Medicare Other

## 2021-07-25 ENCOUNTER — Other Ambulatory Visit: Payer: Medicare Other

## 2021-09-03 ENCOUNTER — Other Ambulatory Visit: Payer: Self-pay | Admitting: Nurse Practitioner

## 2021-09-03 DIAGNOSIS — K7469 Other cirrhosis of liver: Secondary | ICD-10-CM

## 2021-09-03 DIAGNOSIS — K7581 Nonalcoholic steatohepatitis (NASH): Secondary | ICD-10-CM

## 2021-09-11 ENCOUNTER — Ambulatory Visit
Admission: RE | Admit: 2021-09-11 | Discharge: 2021-09-11 | Disposition: A | Discharge status: Home / Self Care | Payer: Medicare Other | Source: Ambulatory Visit | Attending: Nurse Practitioner | Admitting: Nurse Practitioner

## 2021-09-11 DIAGNOSIS — K7581 Nonalcoholic steatohepatitis (NASH): Secondary | ICD-10-CM

## 2021-09-11 DIAGNOSIS — K7469 Other cirrhosis of liver: Secondary | ICD-10-CM

## 2021-09-12 ENCOUNTER — Telehealth (HOSPITAL_COMMUNITY): Payer: Self-pay | Admitting: *Deleted

## 2021-09-12 NOTE — Telephone Encounter (Signed)
Patient given detailed instructions per Myocardial Perfusion Study Information Sheet for the test on 09/19/2021 at 10:00. Patient notified to arrive 15 minutes early and that it is imperative to arrive on time for appointment to keep from having the test rescheduled.  If you need to cancel or reschedule your appointment, please call the office within 24 hours of your appointment. . Patient verbalized understanding.Arthur Francis

## 2021-09-19 ENCOUNTER — Other Ambulatory Visit: Payer: Medicare Other

## 2021-09-19 ENCOUNTER — Ambulatory Visit (HOSPITAL_COMMUNITY): Payer: Medicare Other | Attending: Internal Medicine

## 2021-09-19 DIAGNOSIS — I25118 Atherosclerotic heart disease of native coronary artery with other forms of angina pectoris: Secondary | ICD-10-CM

## 2021-09-19 DIAGNOSIS — E785 Hyperlipidemia, unspecified: Secondary | ICD-10-CM

## 2021-09-19 LAB — MYOCARDIAL PERFUSION IMAGING
LV dias vol: 97 mL (ref 62–150)
LV sys vol: 31 mL
Nuc Stress EF: 67 %
Peak HR: 88 {beats}/min
Rest HR: 58 {beats}/min
Rest Nuclear Isotope Dose: 10.2 mCi
SDS: 2
SRS: 0
SSS: 2
ST Depression (mm): 0 mm
Stress Nuclear Isotope Dose: 32.9 mCi
TID: 1.03

## 2021-09-19 LAB — LIPID PANEL
Chol/HDL Ratio: 2.7 ratio (ref 0.0–5.0)
Cholesterol, Total: 94 mg/dL — ABNORMAL LOW (ref 100–199)
HDL: 35 mg/dL — ABNORMAL LOW (ref >39)
LDL Chol Calc (NIH): 39 mg/dL (ref 0–99)
Triglycerides: 104 mg/dL (ref 0–149)
VLDL Cholesterol Cal: 20 mg/dL (ref 5–40)

## 2021-09-19 MED ORDER — TECHNETIUM TC 99M TETROFOSMIN IV KIT
10.2000 | PACK | Freq: Once | INTRAVENOUS | Status: AC | PRN
  Administered 2021-09-19: 10.2 via INTRAVENOUS

## 2021-09-19 MED ORDER — TECHNETIUM TC 99M TETROFOSMIN IV KIT
32.9000 | PACK | Freq: Once | INTRAVENOUS | Status: AC | PRN
  Administered 2021-09-19: 32.9 via INTRAVENOUS

## 2021-09-19 MED ORDER — REGADENOSON 0.4 MG/5ML IV SOLN
0.4000 mg | Freq: Once | INTRAVENOUS | Status: AC
  Administered 2021-09-19: 0.4 mg via INTRAVENOUS

## 2021-09-22 NOTE — Progress Notes (Signed)
Pt has been made aware of normal result and verbalized understanding.  jw

## 2022-01-26 ENCOUNTER — Ambulatory Visit
Admission: RE | Admit: 2022-01-26 | Discharge: 2022-01-26 | Disposition: A | Discharge status: Home / Self Care | Payer: Medicare Other | Source: Ambulatory Visit | Attending: Urgent Care | Admitting: Urgent Care

## 2022-01-26 VITALS — BP 152/91 | HR 68 | Temp 98.7°F | Resp 15

## 2022-01-26 DIAGNOSIS — Z9889 Other specified postprocedural states: Secondary | ICD-10-CM | POA: Diagnosis not present

## 2022-01-26 DIAGNOSIS — M79621 Pain in right upper arm: Secondary | ICD-10-CM

## 2022-01-26 MED ORDER — ACETAMINOPHEN 325 MG PO TABS: 650.0000 mg | ORAL_TABLET | Freq: Four times a day (QID) | ORAL | 0 refills | Status: DC | PRN

## 2022-01-26 MED ORDER — PREDNISONE 10 MG PO TABS: 30.0000 mg | ORAL_TABLET | Freq: Every day | ORAL | 0 refills | Status: DC
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
## 2022-01-26 NOTE — Discharge Instructions (Signed)
Please contact your orthopedist with [facility]. You will likely need an MRI to rule out an injury such as tear to the triceps muscle. In the meantime, use Tylenol for pain relief and start prednisone for the next 5 days for anti-inflammatory properties.

## 2022-01-26 NOTE — ED Triage Notes (Signed)
Pt c/o RUE pain x 5 months after he was pulled down by his dog-also reports fall/injured RUE x 2 since first injury-NAD-steady gait

## 2022-03-11 LAB — LAB REPORT - SCANNED
A1c: 5.4
EGFR: 56

## 2022-03-26 ENCOUNTER — Telehealth: Payer: Self-pay | Admitting: Physician Assistant

## 2022-03-26 NOTE — Telephone Encounter (Signed)
Spoke with the patient and advised him to have his surgeon's office send over a clearance request. Patient verbalized understanding.

## 2022-03-26 NOTE — Telephone Encounter (Signed)
Patient called and said that he is having eye surgery and need cardiac clearance. Informed patient that office would need to call. Patient would like a call back to confirm

## 2022-05-11 NOTE — Progress Notes (Unsigned)
Cardiology Office Note:    Date:  05/12/2022  ID:  Arthur Francis, DOB 1955-12-04, MRN [id number] [facility] Providers Cardiologist:  Sherren Mocha, MD Cardiology APP:  Liliane Shi, PA-C      Patient Profile:   Coronary artery disease S/p NSTEMI 10/2004 treated with a BMS to the proximal LAD Cath 2010: LAD prox stent patent with 20% ISR Myoview 02/2015: Myocardial perfusion is normal. EF: 64%.   TTE 05/29/15: EF 50-55, normal wall motion, normal diastolic function, PASP 23 Myoview 09/19/21: no ischemia, EF 65, low risk  Chronic diastolic CHF Hx of ascites in 2012/2013 - managed with Lasix at that time Hypertension Hyperlipidemia  Carotid US 06/01/18: Bilateral ICA <50 Hepatitis C s/p Harvoni Tx Hepatic cirrhosis Hx of CVA (asymptomatic; incidental lacunar infarct on MRI in [address]23)     History of Present Illness:   Arthur Francis is a 67 y.o. male who returns for f/u on CAD. He was last seen 05/14/21. He had some chest pain and a f/u Myoview was low risk. He is here alone today. He notes that he was admitted to [facility] in Sept 2023 with dysarthria after smoking crack cocaine. Notes from his hospitalization were reviewed today. MRI and CT were neg for acute CVA. He had AKI on chronic kidney disease. Creatinine improved to close to baseline at DC. His hsTrops were minimally elevated without clear trend. This was not c/w ACS. He is concerned about an old stroke noted on his MRI. I reviewed this with him today. It showed a lacunar infarct in the posterior R lentiform nucleus and changes c/w ischemic disease. I explained to him that this was likely related to hypertension or small vessel disease. He notes he is not using cocaine anymore. He has not had chest pain, shortness of breath, syncope, edema.   ROS See HPI    Studies Reviewed:    EKG:  NSR, HR 69, normal axis, QTc 396 ms, no change from prior tracing 05/14/21   Risk Assessment/Calculations:             Physical  Exam:   VS:  BP 118/60   Pulse 69   Ht 6\' 1"  (1.854 m)   Wt 185 lb 9.6 oz (84.2 kg)   SpO2 93%   BMI 24.49 kg/m    Wt Readings from Last 3 Encounters:  05/12/22 185 lb 9.6 oz (84.2 kg)  09/19/21 187 lb (84.8 kg)  05/14/21 187 lb 3.2 oz (84.9 kg)    Constitutional:      Appearance: Healthy appearance. Not in distress.  Neck:     Vascular: No carotid bruit. JVD normal.  Pulmonary:     Breath sounds: Normal breath sounds. No wheezing. No rales.  Cardiovascular:     Normal rate. Regular rhythm. Normal S1. Normal S2.      Murmurs: There is no murmur.  Edema:    Peripheral edema absent.  Abdominal:     Palpations: Abdomen is soft.       ASSESSMENT AND PLAN:   Coronary artery disease involving native coronary artery of native heart without angina pectoris History of non-STEMI in 2006 treated with a bare-metal stent to the proximal LAD.  Cardiac catheterization in 2010 demonstrated patent stent.  Myoview in 08/2021 was low risk. He had minimal elevation in hsTrops at [facility] in 10/2021 when he was admitted with acute intoxication from crack cocaine. There was no trend and the hsTrops were not c/w ACS. No further  CV testing is needed. He is doing well w/o angina. Continue ASA 81 mg once daily, Crestor 40 mg once daily. F/u 1 year.  Hyperlipidemia LDL goal <70 LDL in July 2023 was optimal at 39. Continue Crestor 40 mg once daily.   Essential hypertension BP is controlled. Continue Toprol XL 25 mg once daily, Lisinopril 10 mg once daily. Request recent BMET from primary care.   Stage 3a chronic kidney disease (HCC) eGFR by labs at [facility] in 10/2021 was 50. Will request updated labs done with primary care.   Cocaine substance abuse ([place]) He notes he was smoking crack cocaine last year. As noted he was hospitalized with dysarthria. It was felt this was due to acute intoxication. He had no acute CVA noted on testing and his speech returned to normal. He notes he is no longer using  crack cocaine.   Smoker He admits he cannot quit.   Hepatic cirrhosis ([place]) Followed by [facility].          Dispo:  Return in about 1 year (around 05/12/2023) for Routine Follow Up, w/ Richardson Dopp, PA-C. Signed, Richardson Dopp, PA-C

## 2022-05-12 ENCOUNTER — Ambulatory Visit: Payer: 59 | Attending: Physician Assistant | Admitting: Physician Assistant

## 2022-05-12 ENCOUNTER — Encounter: Payer: Self-pay | Admitting: Physician Assistant

## 2022-05-12 VITALS — BP 118/60 | HR 69 | Ht 73.0 in | Wt 185.6 lb

## 2022-05-12 DIAGNOSIS — I10 Essential (primary) hypertension: Secondary | ICD-10-CM | POA: Diagnosis not present

## 2022-05-12 DIAGNOSIS — E785 Hyperlipidemia, unspecified: Secondary | ICD-10-CM

## 2022-05-12 DIAGNOSIS — K7469 Other cirrhosis of liver: Secondary | ICD-10-CM

## 2022-05-12 DIAGNOSIS — F17200 Nicotine dependence, unspecified, uncomplicated: Secondary | ICD-10-CM

## 2022-05-12 DIAGNOSIS — I2510 Atherosclerotic heart disease of native coronary artery without angina pectoris: Secondary | ICD-10-CM | POA: Diagnosis not present

## 2022-05-12 DIAGNOSIS — N1831 Chronic kidney disease, stage 3a: Secondary | ICD-10-CM | POA: Insufficient documentation

## 2022-05-12 DIAGNOSIS — F1410 Cocaine abuse, uncomplicated: Secondary | ICD-10-CM | POA: Insufficient documentation

## 2022-05-12 NOTE — Assessment & Plan Note (Signed)
Followed by [facility].

## 2022-05-12 NOTE — Assessment & Plan Note (Addendum)
History of non-STEMI in 2006 treated with a bare-metal stent to the proximal LAD.  Cardiac catheterization in 2010 demonstrated patent stent.  Myoview in 08/2021 was low risk. He had minimal elevation in hsTrops at [facility] in 10/2021 when he was admitted with acute intoxication from crack cocaine. There was no trend and the hsTrops were not c/w ACS. No further CV testing is needed. He is doing well w/o angina. Continue ASA 81 mg once daily, Crestor 40 mg once daily. F/u 1 year.

## 2022-05-12 NOTE — Assessment & Plan Note (Signed)
BP is controlled. Continue Toprol XL 25 mg once daily, Lisinopril 10 mg once daily. Request recent BMET from primary care.

## 2022-05-12 NOTE — Assessment & Plan Note (Signed)
He admits he cannot quit.

## 2022-05-12 NOTE — Assessment & Plan Note (Signed)
LDL in July 2023 was optimal at 39. Continue Crestor 40 mg once daily.

## 2022-05-12 NOTE — Patient Instructions (Signed)
Medication Instructions:  Your physician recommends that you continue on your current medications as directed. Please refer to the Current Medication list given to you today.  *If you need a refill on your cardiac medications before your next appointment, please call your pharmacy*   Lab Work: None ordered  If you have labs (blood work) drawn today and your tests are completely normal, you will receive your results only by: [facility] (if you have MyChart) OR A paper copy in the mail If you have any lab test that is abnormal or we need to change your treatment, we will call you to review the results.   Testing/Procedures: None ordered   Follow-Up: At [facility], you and your health needs are our priority.  As part of our continuing mission to provide you with exceptional heart care, we have created designated Provider Care Teams.  These Care Teams include your primary Cardiologist (physician) and Advanced Practice Providers (APPs -  Physician Assistants and Nurse Practitioners) who all work together to provide you with the care you need, when you need it.  We recommend signing up for the patient portal called "MyChart".  Sign up information is provided on this After Visit Summary.  MyChart is used to connect with patients for Virtual Visits (Telemedicine).  Patients are able to view lab/test results, encounter notes, upcoming appointments, etc.  Non-urgent messages can be sent to your provider as well.   To learn more about what you can do with MyChart, go to [email or web address].    Your next appointment:   12 month(s)  Provider:   Richardson Dopp, PA-C         Other Instructions

## 2022-05-12 NOTE — Assessment & Plan Note (Signed)
eGFR by labs at [facility] in 10/2021 was 50. Will request updated labs done with primary care.

## 2022-05-12 NOTE — Assessment & Plan Note (Signed)
He notes he was smoking crack cocaine last year. As noted he was hospitalized with dysarthria. It was felt this was due to acute intoxication. He had no acute CVA noted on testing and his speech returned to normal. He notes he is no longer using crack cocaine.

## 2022-05-14 ENCOUNTER — Other Ambulatory Visit: Payer: Self-pay | Admitting: Cardiovascular Disease

## 2022-09-01 ENCOUNTER — Other Ambulatory Visit: Payer: Self-pay | Admitting: *Deleted

## 2022-09-01 MED ORDER — ROSUVASTATIN CALCIUM 40 MG PO TABS: 40.0000 mg | ORAL_TABLET | Freq: Every day | ORAL | 3 refills | Status: DC

## 2022-09-16 IMAGING — US US ABDOMEN LIMITED
1 series · 14 of 25 positions shown · non-contrast
Comparison: Abdomen MRI 03/10/2016.  Abdomen ultrasound 09/28/2019

CLINICAL DATA: 64-year-old male with cirrhosis. Episode of severe
abdominal pain and vomiting.

EXAM:
ULTRASOUND ABDOMEN LIMITED RIGHT UPPER QUADRANT

[Series 1: us abdomen limited · 0.28mm/px · 14 of 58 slices shown]
[im 1/58]
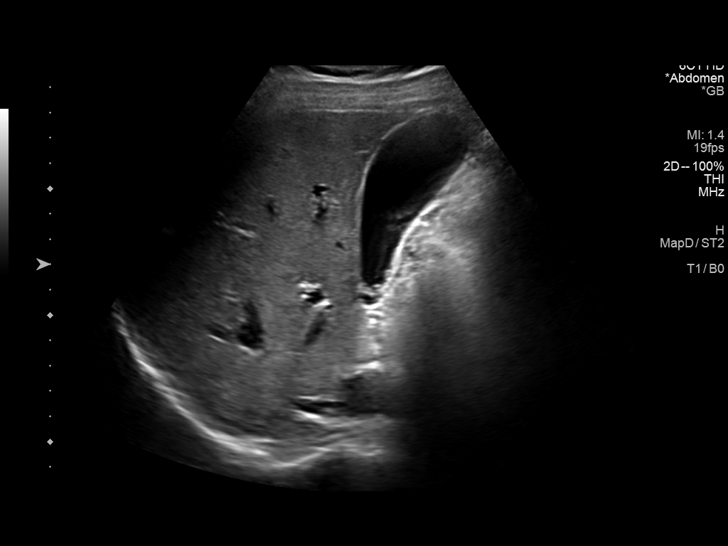
[im 5/58]
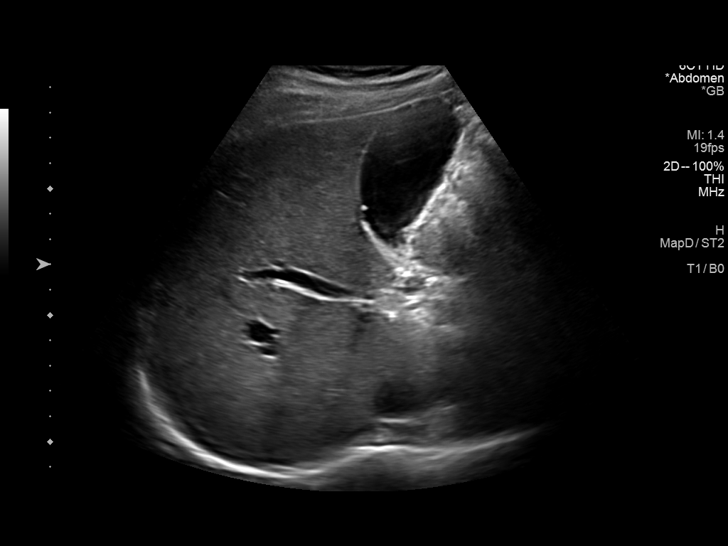
[im 10/58]
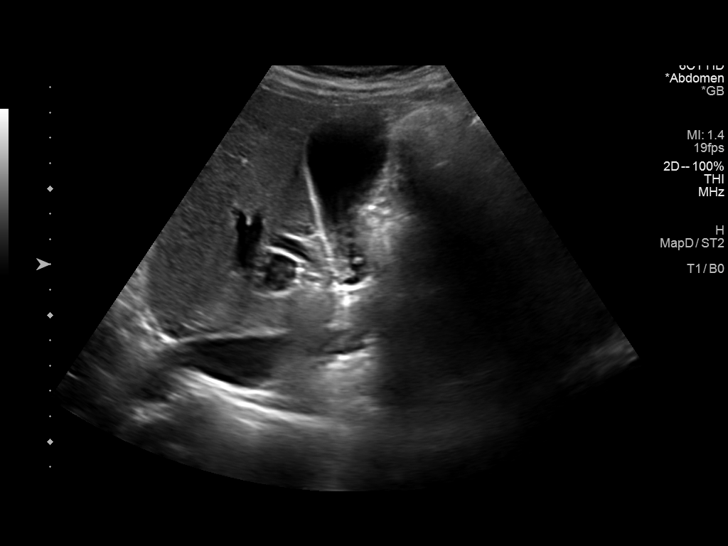
[im 15/58]
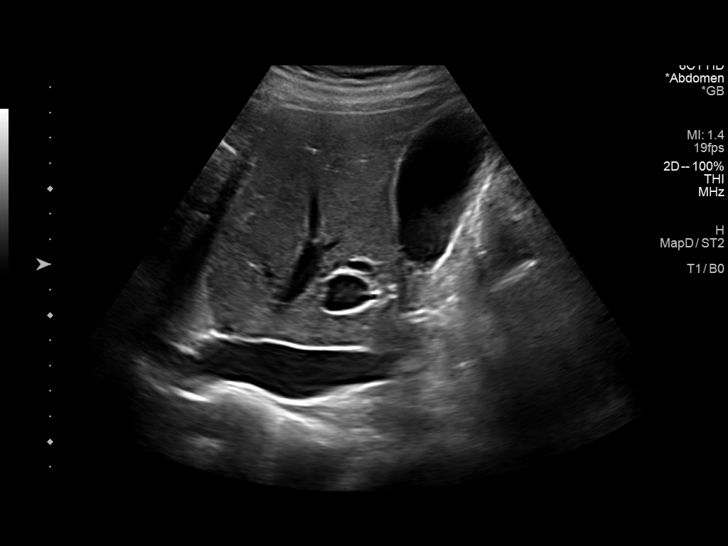
[im 20/58]
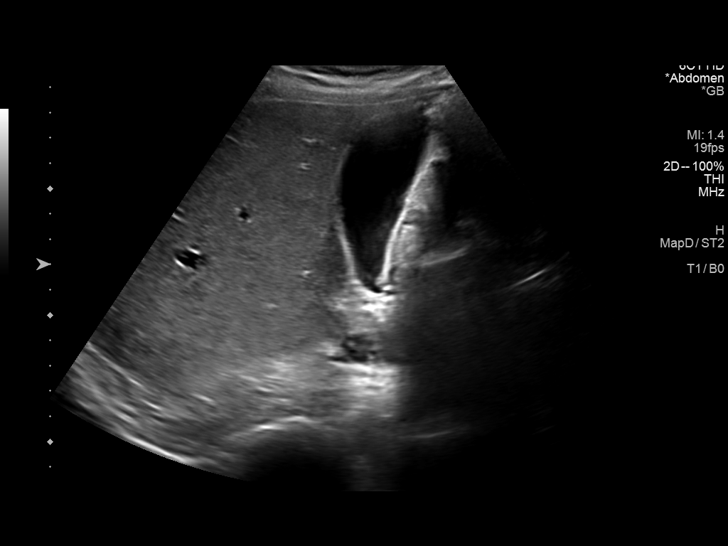
[im 22/58]
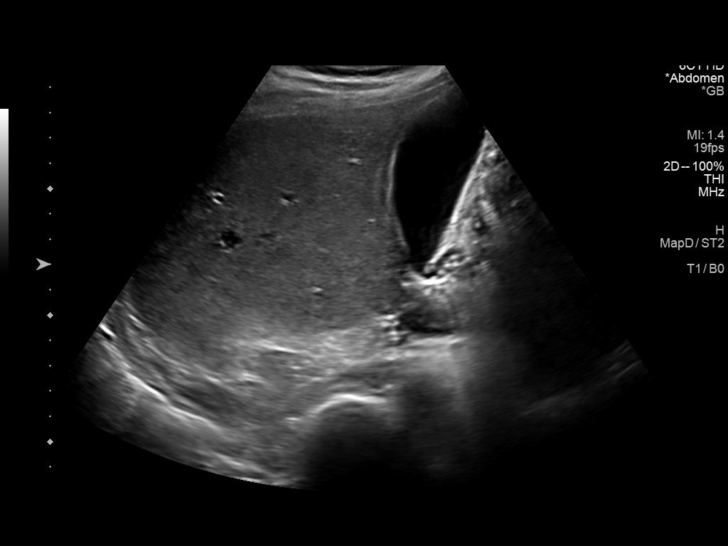
[im 27/58]
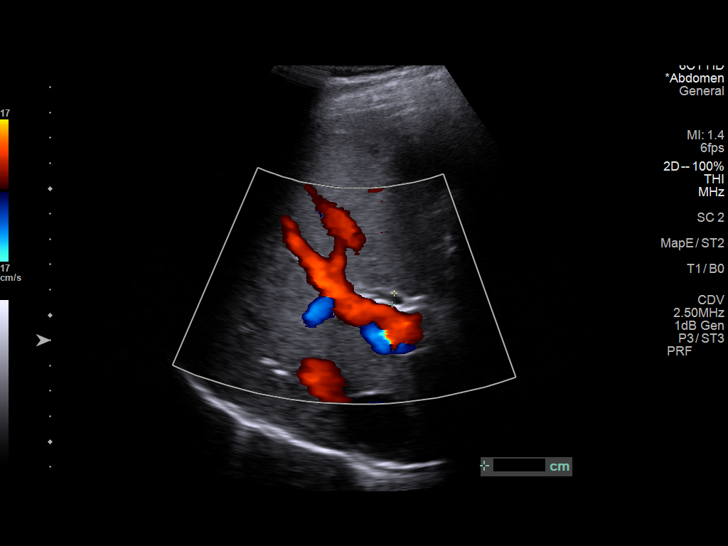
[im 31/58]
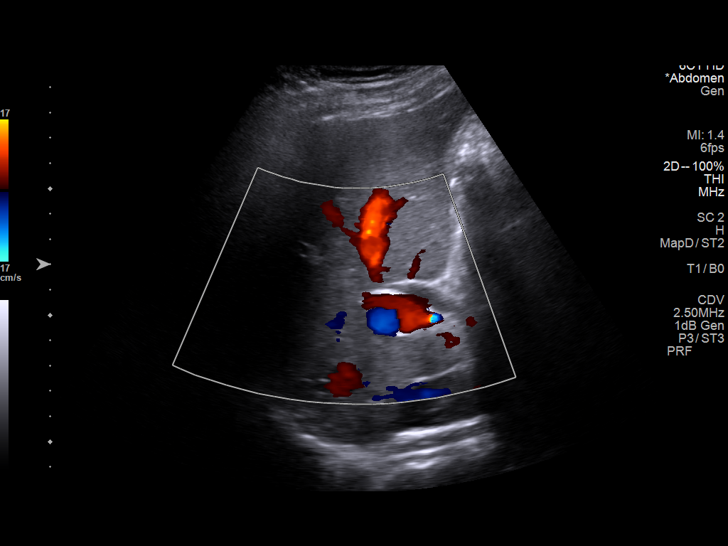
[im 36/58]
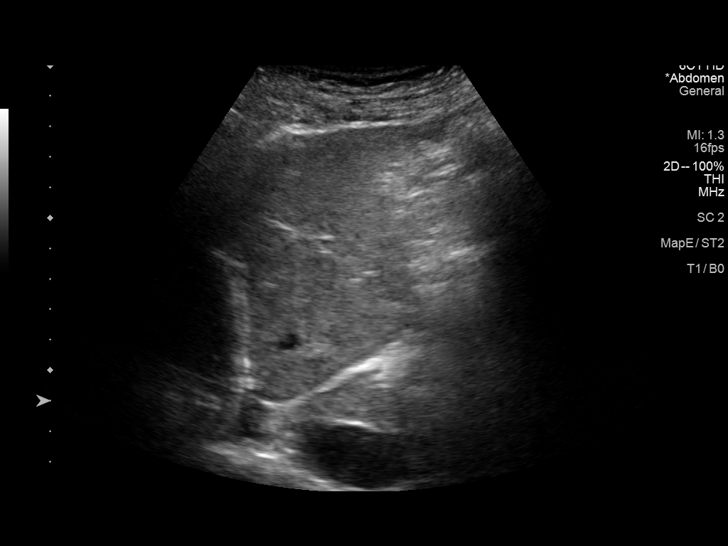
[im 39/58]
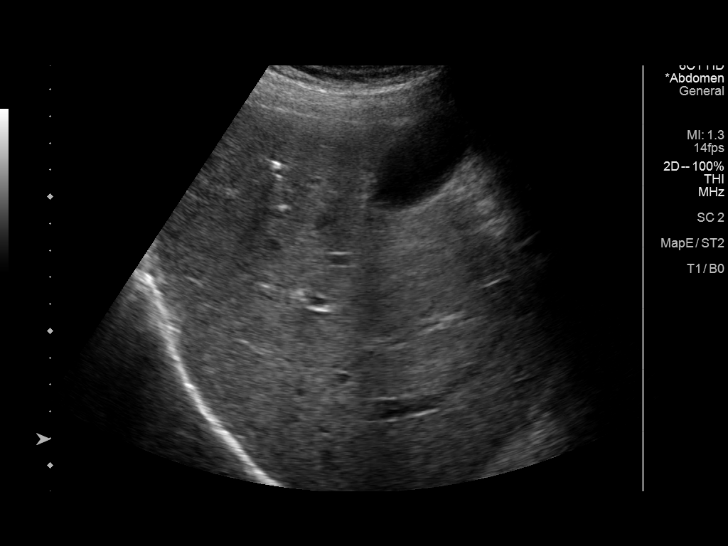
[im 43/58]
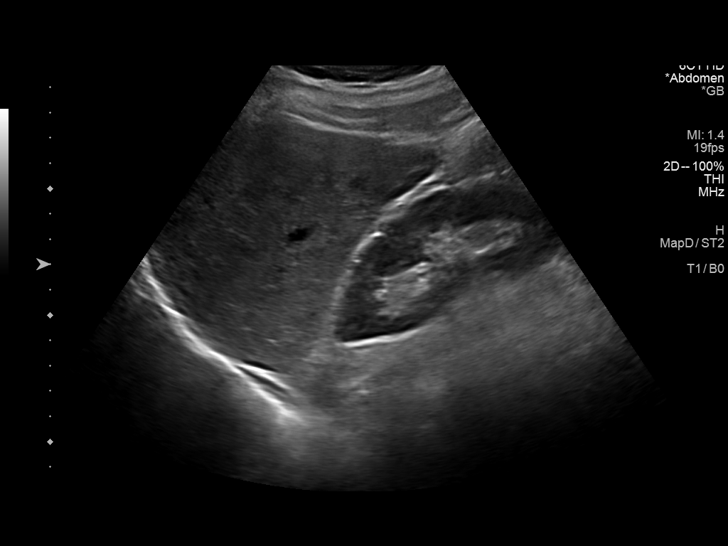
[im 48/58]
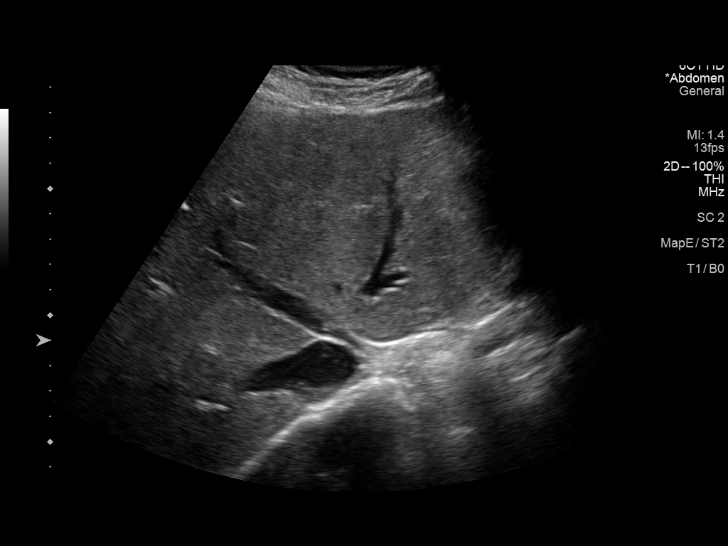
[im 53/58]
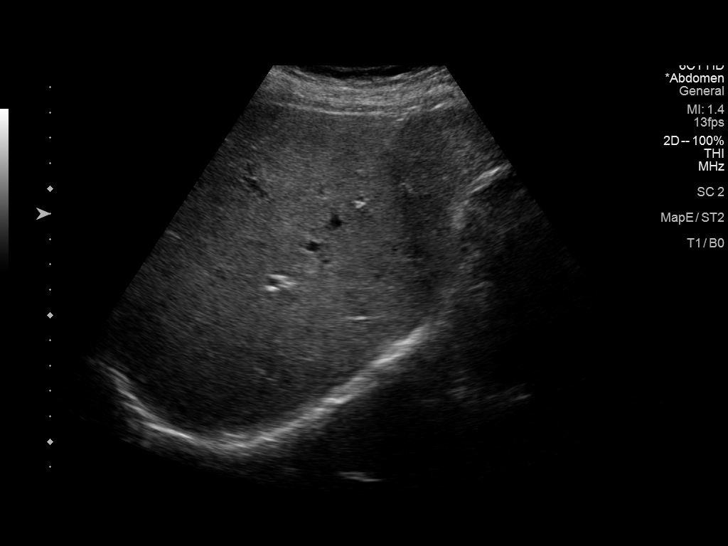
[im 58/58]
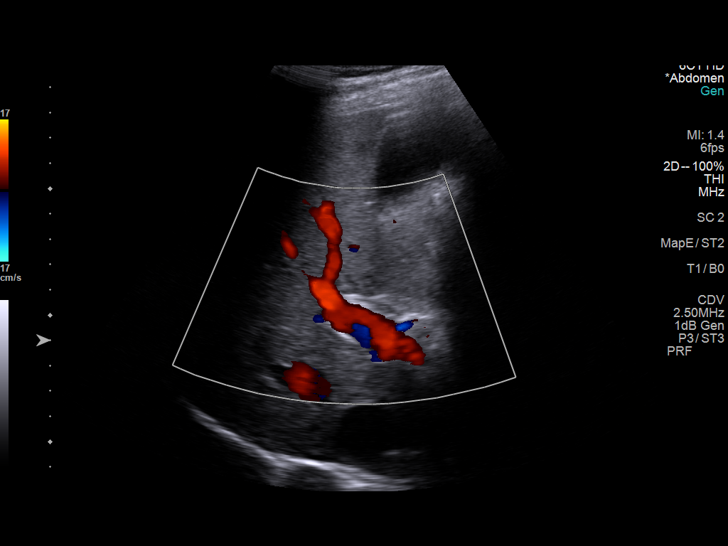

[14 of 25 positions shown; findings below may reference images not displayed]

FINDINGS: Gallbladder:

Background gallbladder wall thickness is normal. Questionable 6 mm
gallstone in the lower gallbladder neck on image 21 today. Otherwise
small gallbladder polyps in the gallbladder neck appears stable and
inconsequential. No pericholecystic fluid. No sonographic Murphy
sign.

Common bile duct:

Diameter: 7 mm, upper limits of normal.

Liver:

Background liver echogenicity is within normal limits (image 46)
with mildly coarse hepatic echotexture throughout. No intrahepatic
biliary ductal dilatation. No discrete liver lesion. Portal vein is
patent on color Doppler imaging with normal direction of blood flow
towards the liver.

Other: Negative visible right kidney.
IMPRESSION: 1. No discrete liver lesion by ultrasound.
2. Several small gallbladder polyps in the gallbladder neck appear
stable and inconsequential. Questionable superimposed 6 mm
gallstone, but no evidence of acute cholecystitis.

## 2022-10-08 ENCOUNTER — Other Ambulatory Visit: Payer: Self-pay | Admitting: Physician Assistant

## 2022-11-26 ENCOUNTER — Other Ambulatory Visit: Payer: Self-pay | Admitting: Physician Assistant

## 2023-02-18 ENCOUNTER — Telehealth: Payer: Self-pay | Admitting: Physician Assistant

## 2023-02-18 MED ORDER — NITROGLYCERIN 0.4 MG SL SUBL: 0.4000 mg | SUBLINGUAL_TABLET | SUBLINGUAL | 0 refills | Status: AC | PRN

## 2023-02-18 NOTE — Telephone Encounter (Signed)
*  STAT* If patient is at the pharmacy, call can be transferred to refill team.   1. Which medications need to be refilled? (please list name of each medication and dose if known) nitroGLYCERIN (NITROSTAT) 0.4 MG SL tablet    2. Would you like to learn more about the convenience, safety, & potential cost savings by using the [facility]?     3. Are you open to using the [facility] (Type [facility]. ).   4. Which pharmacy/location (including street and city if local pharmacy) is medication to be sent to? [facility] - [place], [place] - [address]    5. Do they need a 30 day or 90 day supply? 90

## 2023-05-12 ENCOUNTER — Ambulatory Visit: Payer: 59 | Admitting: Physician Assistant

## 2023-05-24 ENCOUNTER — Other Ambulatory Visit: Payer: Self-pay | Admitting: Physician Assistant

## 2023-07-08 ENCOUNTER — Other Ambulatory Visit: Payer: Self-pay | Admitting: Orthopedic Surgery

## 2023-07-08 ENCOUNTER — Telehealth: Payer: Self-pay | Admitting: Cardiovascular Disease

## 2023-07-08 NOTE — Telephone Encounter (Signed)
   Name: Arthur Francis  DOB: [birth year]  MRN: [id number]  Primary Cardiologist: Arnoldo Lapping, MD  Chart reviewed as part of pre-operative protocol coverage. The patient has an upcoming visit scheduled with Marlyse Single, PA on 08/04/2023 at which time clearance can be addressed in case there are any issues that would impact surgical recommendations.  Left carpal tunnel release, left ulnar nerve decompression at elbow is not scheduled until 08/17/2023 as below. I added preop FYI to appointment note so that provider is aware to address at time of outpatient visit.  Per office protocol the cardiology provider should forward their finalized clearance decision and recommendations regarding antiplatelet therapy to the requesting party below.    Ideally aspirin  should be continued without interruption, however if the bleeding risk is too great, aspirin  may be held for 5-7 days prior to surgery. Please resume aspirin  post operatively when it is felt to be safe from a bleeding standpoint.    I will route this message as FYI to requesting party and remove this message from the preop box as separate preop APP input not needed at this time.   Please call with any questions.  Ava Boatman, NP  07/08/2023, 10:49 AM

## 2023-07-08 NOTE — Telephone Encounter (Signed)
   Pre-operative Risk Assessment    Patient Name: Arthur Francis  DOB: 1956/01/01 MRN: [id number]   Date of last office visit: 05/12/22 Date of next office visit: 08/04/23   Request for Surgical Clearance    Procedure:  Left Carpal Tunnel Release; Left Ulna Nerve Decompression at Elbow, possible transposition  Date of Surgery:  Clearance 08/17/23                                Surgeon:  Dr. Brunilda Capra Surgeon's Group or Practice Name:  [facility] Phone number:  [phone]  Fax number:  [phone]   Type of Clearance Requested:   - Medical    Type of Anesthesia:  Auxiliary block   Additional requests/questions:  Caller Cornelius Dill) stated patient needs medical clearance  Signed, Chrystie Crass   07/08/2023, 8:42 AM

## 2023-07-12 ENCOUNTER — Ambulatory Visit: Admitting: Physician Assistant

## 2023-08-03 NOTE — Progress Notes (Deleted)
 OFFICE NOTE Date:  08/03/2023  ID:  Arthur Francis, DOB 1955-06-02, MRN [id number] PCP: Georgean Kindle, MD  [facility] Providers Cardiologist:  Arnoldo Lapping, MD Cardiology APP:  Gabino Joe, PA-C { Click to update primary MD,subspecialty MD or APP then REFRESH:1}    Patient Profile:     Coronary artery disease S/p NSTEMI 10/2004 treated with a BMS to the proximal LAD Cath 2010: LAD prox stent patent with 20% ISR Myoview  02/2015: Myocardial perfusion is normal. EF: 64%.   TTE 05/29/15: EF 50-55, normal wall motion, normal diastolic function, PASP 23 Myoview  09/19/21: no ischemia, EF 65, low risk  Chronic (HFpEF) heart failure with preserved ejection fraction  Hx of ascites in 2012/2013 - managed with Lasix  at that time Hypertension Hyperlipidemia  Carotid US  06/01/18: Bilateral ICA <50 Hepatitis C s/p Harvoni Tx Hepatic cirrhosis Hx of CVA (asymptomatic; incidental lacunar infarct on MRI in [address]) Hx of drug use (admitted in 10/2021 w dysarthria after smoking Crack Cocaine        Discussed the use of AI scribe software for clinical note transcription with the patient, who gave verbal consent to proceed. History of Present Illness Arthur Francis is a 68 y.o. male who returns for surgical clearance, follow-up of CAD.  He was last seen March 2024.  He needs to undergo left carpal tunnel release, left ulnar nerve decompression 08/17/2023 with Dr. Huntley Mai.  Type of anesthesia: Auxiliary block.    ROS-See HPI***    Studies Reviewed:      *** Results  Risk Assessment/Calculations: {Does this patient have ATRIAL FIBRILLATION?:[phone]} No BP recorded.  {Refresh Note OR Click here to enter BP  :1}***      Physical Exam:  VS:  There were no vitals taken for this visit.   Wt Readings from Last 3 Encounters:  05/12/22 185 lb 9.6 oz (84.2 kg)  09/19/21 187 lb (84.8 kg)  05/14/21 187 lb 3.2 oz (84.9 kg)    Physical Exam***     Assessment and Plan: Assessment &  Plan Preoperative cardiovascular examination {Click Here to Calculate RCRI      :[id number]}  { Click Here to Calculate DASI      :[id number]} {Select to add RCRI Risk (<1%=LOW; >/=1%=HIGH) (Optional):21036017}  {Select if HIGH (RCRI >/=1%) Risk (Optional):21036030} Recommendations: {2014 ACC/AHA Perioperative Guidelines  :21036001} Antiplatelet and/or Anticoagulation Recommendations: {Antiplatelet Recommendations                  :21036016} {Anticoagulation Recommendations           :84696295}  Coronary artery disease involving native coronary artery of native heart without angina pectoris  Essential hypertension  Hyperlipidemia LDL goal <70  Stage 3a chronic kidney disease (HCC)  Assessment and Plan Assessment & Plan    {  Coronary artery disease involving native coronary artery of native heart without angina pectoris History of non-STEMI in 2006 treated with a bare-metal stent to the proximal LAD.  Cardiac catheterization in 2010 demonstrated patent stent.  Myoview  in 08/2021 was low risk. He had minimal elevation in hsTrops at [facility] in 10/2021 when he was admitted with acute intoxication from crack cocaine. There was no trend and the hsTrops were not c/w ACS. No further CV testing is needed. He is doing well w/o angina. Continue ASA 81 mg once daily, Crestor  40 mg once daily. F/u 1 year.   Hyperlipidemia LDL goal <70 LDL in July 2023 was optimal at 39. Continue Crestor  40 mg  once daily.    Essential hypertension BP is controlled. Continue Toprol  XL 25 mg once daily, Lisinopril  10 mg once daily. Request recent BMET from primary care.    Stage 3a chronic kidney disease (HCC) eGFR by labs at [facility] in 10/2021 was 50. Will request updated labs done with primary care.    Cocaine substance abuse (HCC) He notes he was smoking crack cocaine last year. As noted he was hospitalized with dysarthria. It was felt this was due to acute intoxication. He had no acute CVA noted on testing  and his speech returned to normal. He notes he is no longer using crack cocaine.    Smoker He admits he cannot quit.    Hepatic cirrhosis (HCC) Followed by [facility].     :1}    {Are you ordering a CV Procedure (e.g. stress test, cath, DCCV, TEE, etc)?   Press F2        :[id number]}  Dispo:  No follow-ups on file. Signed, Marlyse Single, PA-C

## 2023-08-04 ENCOUNTER — Ambulatory Visit: Admitting: Physician Assistant

## 2023-08-04 DIAGNOSIS — E785 Hyperlipidemia, unspecified: Secondary | ICD-10-CM

## 2023-08-04 DIAGNOSIS — I2510 Atherosclerotic heart disease of native coronary artery without angina pectoris: Secondary | ICD-10-CM

## 2023-08-04 DIAGNOSIS — N1831 Chronic kidney disease, stage 3a: Secondary | ICD-10-CM

## 2023-08-04 DIAGNOSIS — I10 Essential (primary) hypertension: Secondary | ICD-10-CM

## 2023-08-04 DIAGNOSIS — Z01810 Encounter for preprocedural cardiovascular examination: Secondary | ICD-10-CM

## 2023-09-01 NOTE — Telephone Encounter (Signed)
 Pt called in to cancel appt on 09/17/23 due to no longer at this time having surgery.

## 2023-09-08 ENCOUNTER — Other Ambulatory Visit: Payer: Self-pay | Admitting: Physician Assistant

## 2023-09-17 ENCOUNTER — Ambulatory Visit: Admitting: Cardiology

## 2023-10-05 ENCOUNTER — Encounter (HOSPITAL_BASED_OUTPATIENT_CLINIC_OR_DEPARTMENT_OTHER): Payer: Self-pay

## 2023-10-05 ENCOUNTER — Ambulatory Visit (HOSPITAL_BASED_OUTPATIENT_CLINIC_OR_DEPARTMENT_OTHER): Admit: 2023-10-05 | Admitting: Orthopedic Surgery

## 2023-10-05 SURGERY — CARPAL TUNNEL RELEASE
Anesthesia: Monitor Anesthesia Care | Site: Wrist | Laterality: Left

## 2023-10-07 ENCOUNTER — Other Ambulatory Visit: Payer: Self-pay | Admitting: Physician Assistant

## 2023-10-27 ENCOUNTER — Ambulatory Visit: Attending: Physician Assistant | Admitting: Physician Assistant

## 2023-10-27 ENCOUNTER — Encounter: Payer: Self-pay | Admitting: Physician Assistant

## 2023-10-27 VITALS — BP 134/66 | HR 64 | Ht 73.0 in | Wt 171.6 lb

## 2023-10-27 DIAGNOSIS — R011 Cardiac murmur, unspecified: Secondary | ICD-10-CM

## 2023-10-27 DIAGNOSIS — Z720 Tobacco use: Secondary | ICD-10-CM | POA: Diagnosis not present

## 2023-10-27 DIAGNOSIS — E785 Hyperlipidemia, unspecified: Secondary | ICD-10-CM

## 2023-10-27 DIAGNOSIS — Z136 Encounter for screening for cardiovascular disorders: Secondary | ICD-10-CM

## 2023-10-27 DIAGNOSIS — I5032 Chronic diastolic (congestive) heart failure: Secondary | ICD-10-CM

## 2023-10-27 DIAGNOSIS — I2510 Atherosclerotic heart disease of native coronary artery without angina pectoris: Secondary | ICD-10-CM

## 2023-10-27 DIAGNOSIS — I10 Essential (primary) hypertension: Secondary | ICD-10-CM

## 2023-10-27 NOTE — Progress Notes (Signed)
 OFFICE NOTE:    Date:  10/27/2023  ID:  Arthur Francis, DOB 15-Jan-1956, MRN [id number] PCP: Pia Kerney SQUIBB, MD  [facility] Providers Cardiologist:  Ozell Fell, MD Cardiology APP:  Lelon Glendia DASEN, PA-C        Coronary artery disease S/p NSTEMI 10/2004 treated with a BMS to the proximal LAD Cath 2010: LAD prox stent patent with 20% ISR Myoview  02/2015: Myocardial perfusion is normal. EF: 64%.   TTE 05/29/15: EF 50-55, normal wall motion, normal diastolic function, PASP 23 Myoview  09/19/21: no ischemia, EF 65, low risk  Chronic (HFpEF) heart failure with preserved ejection fraction  Hx of ascites in 2012/2013 - managed with Lasix  at that time Hypertension Hyperlipidemia  Carotid US  06/01/18: Bilateral ICA <50 Hepatitis C s/p Harvoni Tx Hepatic cirrhosis Hx of CVA (asymptomatic; incidental lacunar infarct on MRI in 10/2021) Substance abuse Hx of crack cocaine use  Tobacco use        Discussed the use of AI scribe software for clinical note transcription with the patient, who gave verbal consent to proceed. History of Present Illness Arthur Francis is a 68 y.o. male who returns for surgical clearance and follow-up of CAD, CHF. He was last seen 04/2022.  Notes indicate the patient was pending left carpal tunnel release and left ulnar nerve decompression in June 2025 with Dr. Murrell under axillary block.  Most recent notes from July indicated the patient decided to cancel his surgery.  He feels generally well without significant chest pain, heaviness, or pressure, except for one instance after [place]. No chest pain with exertion as long as he does not get too hot. He experiences some shortness of breath but attributes it to anxiety. No recent hospital admissions since his last visit. He has a history of smoking and reports an increase in smoking from less than a pack a day to two packs a day. He acknowledges the difficulty in quitting smoking.     Review of  Systems  Cardiovascular:  Negative for claudication.  -See HPI     Studies Reviewed:  EKG Interpretation Date/Time:  Wednesday October 27 2023 15:52:40 EDT Ventricular Rate:  64 PR Interval:  154 QRS Duration:  84 QT Interval:  394 QTC Calculation: 406 R Axis:   57  Text Interpretation: Normal sinus rhythm Normal ECG No significant change since last tracing Confirmed by Lelon Glendia [phone]) on 10/27/2023 4:51:31 PM          Physical Exam:  VS:  BP 134/66   Pulse 64   Ht 6' 1 (1.854 m)   Wt 171 lb 9.6 oz (77.8 kg)   SpO2 97%   BMI 22.64 kg/m        Wt Readings from Last 3 Encounters:  10/27/23 171 lb 9.6 oz (77.8 kg)  05/12/22 185 lb 9.6 oz (84.2 kg)  09/19/21 187 lb (84.8 kg)    Constitutional:      Appearance: Healthy appearance. Not in distress.  Neck:     Vascular: No carotid bruit. JVD normal.  Pulmonary:     Breath sounds: Normal breath sounds. No wheezing. No rales.  Cardiovascular:     Normal rate. Regular rhythm.     Murmurs: There is a grade 2/6 systolic murmur at the URSB.  Edema:    Peripheral edema absent.  Abdominal:     Palpations: Abdomen is soft.       Assessment and Plan:    Assessment &  Plan Coronary artery disease involving native coronary artery of native heart without angina pectoris History of non-STEMI in 2006 treated with a bare-metal stent to the proximal LAD.  Cardiac catheterization in 2010 demonstrated patent stent.  Myoview  in 08/2021 was low risk. He had minimal elevation in hsTrops at [facility] in 10/2021 when he was admitted with acute intoxication from crack cocaine. There was no trend and the hsTrops were not c/w ACS. Since last seen, he has done well w/o chest pain to suggest angina.  -Continue ASA 81 mg once daily, Lisinopril  10 mg once daily, NTG prn, Rosuvastatin  40 mg once daily, Metoprolol  succinate 25 mg once daily. -Follow up 1 year  Chronic heart failure with preserved ejection fraction (HCC) NYHA II. Volume status  stable. His PCP started him on Furosemide  about a year ago. -Continue Furosemide  20 mg once daily  -Arrange CMET. Tobacco use He is unable to quit. 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 I recommended getting a 2D echocardiogram to evaluate. He notes his PCP has done that recently. We contacted his PCP's office and no echocardiogram has been done in over a year.  -Echocardiogram  Essential hypertension BP borderline. I have asked him to monitor his BP and send readings in 2 weeks.  -Continue Lisinopril  10 mg once daily, Metoprolol  succinate 25 mg once daily  -If BP above target, will increase Lisinopril .  Hyperlipidemia LDL goal <70 No Lipids done in over a year. He thought his pain mgmt provider checked that. We checked with their office and it has not been checked. -Continue Rosuvastatin  40 mg once daily -Arrange CMET, Lipids  Screening for AAA (aortic abdominal aneurysm) He is > 65 and smokes. I offered a AAA screen but he notes that his PCP has done that test for him. We contacted his PCP. No AAA US  has been done. -AAA screening ultrasound       Dispo:  Return in about 1 year (around 10/26/2024) for Routine Follow Up, w/ Glendia Ferrier, PA-C.  Signed, Glendia Ferrier, PA-C

## 2023-10-27 NOTE — Assessment & Plan Note (Signed)
 No Lipids done in over a year. He thought his pain mgmt provider checked that. We checked with their office and it has not been checked. -Continue Rosuvastatin  40 mg once daily -Arrange CMET, Lipids

## 2023-10-27 NOTE — Assessment & Plan Note (Addendum)
 History of non-STEMI in 2006 treated with a bare-metal stent to the proximal LAD.  Cardiac catheterization in 2010 demonstrated patent stent.  Myoview  in 08/2021 was low risk. He had minimal elevation in hsTrops at [facility] in 10/2021 when he was admitted with acute intoxication from crack cocaine. There was no trend and the hsTrops were not c/w ACS. Since last seen, he has done well w/o chest pain to suggest angina.  -Continue ASA 81 mg once daily, Lisinopril  10 mg once daily, NTG prn, Rosuvastatin  40 mg once daily, Metoprolol  succinate 25 mg once daily. -Follow up 1 year

## 2023-10-27 NOTE — Assessment & Plan Note (Signed)
 BP borderline. I have asked him to monitor his BP and send readings in 2 weeks.  -Continue Lisinopril  10 mg once daily, Metoprolol  succinate 25 mg once daily  -If BP above target, will increase Lisinopril .

## 2023-10-27 NOTE — Patient Instructions (Addendum)
 Medication Instructions:  Your physician recommends that you continue on your current medications as directed. Please refer to the Current Medication list given to you today.  *If you need a refill on your cardiac medications before your next appointment, please call your pharmacy*  Lab Work: None ordered  If you have labs (blood work) drawn today and your tests are completely normal, you will receive your results only by: MyChart Message (if you have MyChart) OR A paper copy in the mail If you have any lab test that is abnormal or we need to change your treatment, we will call you to review the results.  Testing/Procedures: Your physician has requested that you have an echocardiogram. Echocardiography is a painless test that uses sound waves to create images of your heart. It provides your doctor with information about the size and shape of your heart and how well your heart's chambers and valves are working. This procedure takes approximately one hour. There are no restrictions for this procedure. Please do NOT wear cologne, perfume, aftershave, or lotions (deodorant is allowed). Please arrive 15 minutes prior to your appointment time.  Please note: We ask at that you not bring children with you during ultrasound (echo/ vascular) testing. Due to room size and safety concerns, children are not allowed in the ultrasound rooms during exams. Our front office staff cannot provide observation of children in our lobby area while testing is being conducted. An adult accompanying a patient to their appointment will only be allowed in the ultrasound room at the discretion of the ultrasound technician under special circumstances. We apologize for any inconvenience.  Your physician recommends you have the Vascuscreen (AAA Duplex).  Follow-Up: At [facility], you and your health needs are our priority.  As part of our continuing mission to provide you with exceptional heart care, our providers  are all part of one team.  This team includes your primary Cardiologist (physician) and Advanced Practice Providers or APPs (Physician Assistants and Nurse Practitioners) who all work together to provide you with the care you need, when you need it.  Your next appointment:   1 year(s)  Provider:   Glendia Ferrier, PA-C          We recommend signing up for the patient portal called MyChart.  Sign up information is provided on this After Visit Summary.  MyChart is used to connect with patients for Virtual Visits (Telemedicine).  Patients are able to view lab/test results, encounter notes, upcoming appointments, etc.  Non-urgent messages can be sent to your provider as well.   To learn more about what you can do with MyChart, go to [email or web address].   Other Instructions Your physician has requested that you regularly monitor and record your blood pressure readings at home. Please use the same machine at the same time of day to check your readings and record them to bring to your follow-up visit.   Please monitor blood pressures and keep a log of your readings.    Make sure to check 2 hours after your medications for 2 weeks and send them via mychart..    AVOID these things for 30 minutes before checking your blood pressure: No Drinking caffeine. No Drinking alcohol. No Eating. No Smoking. No Exercising.   Five minutes before checking your blood pressure: Pee. Sit in a dining chair. Avoid sitting in a soft couch or armchair. Be quiet. Do not talk

## 2023-10-28 ENCOUNTER — Encounter: Payer: Self-pay | Admitting: Physician Assistant

## 2023-10-28 NOTE — Progress Notes (Signed)
 This encounter was created in error - please disregard.

## 2023-10-28 NOTE — Addendum Note (Signed)
 Addended byBETHA FERRIER, GLENDIA T on: 10/28/2023 05:03 PM   Modules accepted: Orders

## 2023-10-29 NOTE — Addendum Note (Signed)
 Addended byBETHA FERRIER, GLENDIA T on: 10/29/2023 10:06 AM   Modules accepted: Orders

## 2023-11-04 ENCOUNTER — Other Ambulatory Visit: Payer: Self-pay | Admitting: Physician Assistant

## 2023-11-29 ENCOUNTER — Other Ambulatory Visit (HOSPITAL_COMMUNITY)

## 2023-12-01 ENCOUNTER — Ambulatory Visit (HOSPITAL_COMMUNITY): Admission: RE | Admit: 2023-12-01 | Source: Ambulatory Visit

## 2023-12-01 ENCOUNTER — Ambulatory Visit (HOSPITAL_COMMUNITY)

## 2023-12-27 ENCOUNTER — Telehealth (HOSPITAL_COMMUNITY): Payer: Self-pay | Admitting: Physician Assistant

## 2023-12-27 NOTE — Telephone Encounter (Signed)
 Patient called and cancelled echocardiogram for reason below:  12/27/2023 8:57 AM Ab:[id number], ASHLEY K  Cancel Rsn: Patient (Pt does not to complete the test.)   Order will be removed from the active echo WQ. Thank you.

## 2023-12-31 ENCOUNTER — Encounter (HOSPITAL_COMMUNITY): Payer: Self-pay

## 2023-12-31 ENCOUNTER — Ambulatory Visit (HOSPITAL_COMMUNITY)

## 2024-02-23 ENCOUNTER — Other Ambulatory Visit: Payer: Self-pay | Admitting: Physician Assistant

## 2024-02-23 MED ORDER — ROSUVASTATIN CALCIUM 40 MG PO TABS: 40.0000 mg | ORAL_TABLET | Freq: Every day | ORAL | 0 refills | Status: AC
